# Patient Record
Sex: Female | Born: 1940 | Race: Black or African American | Hispanic: No | Marital: Single | Smoking: Never smoker
Health system: Southern US, Community
[De-identification: ages and names within clinical notes are randomized; demographics above are authoritative.]

## PROBLEM LIST (undated history)

## (undated) DIAGNOSIS — K5900 Constipation, unspecified: Secondary | ICD-10-CM

## (undated) DIAGNOSIS — E119 Type 2 diabetes mellitus without complications: Secondary | ICD-10-CM

## (undated) HISTORY — DX: Type 2 diabetes mellitus without complications: E11.9

## (undated) HISTORY — DX: Constipation, unspecified: K59.00

---

## 1977-08-29 HISTORY — PX: KNEE SURGERY: SHX244

## 2007-08-30 HISTORY — PX: SALIVARY GLAND SURGERY: SHX768

## 2007-08-30 HISTORY — PX: COLON SURGERY: SHX602

## 2009-01-19 ENCOUNTER — Emergency Department (HOSPITAL_BASED_OUTPATIENT_CLINIC_OR_DEPARTMENT_OTHER): Admission: EM | Admit: 2009-01-19 | Discharge: 2009-01-19 | Payer: Self-pay | Admitting: Emergency Medicine

## 2009-04-20 ENCOUNTER — Encounter (HOSPITAL_COMMUNITY): Admission: RE | Admit: 2009-04-20 | Discharge: 2009-04-20 | Payer: Self-pay | Admitting: Cardiology

## 2010-12-07 LAB — URINALYSIS, ROUTINE W REFLEX MICROSCOPIC
Leukocytes, UA: NEGATIVE
Nitrite: NEGATIVE
Protein, ur: NEGATIVE mg/dL
Specific Gravity, Urine: 1.033 — ABNORMAL HIGH (ref 1.005–1.030)
Urobilinogen, UA: 0.2 mg/dL (ref 0.0–1.0)

## 2010-12-07 LAB — BASIC METABOLIC PANEL
BUN: 14 mg/dL (ref 6–23)
CO2: 29 mEq/L (ref 19–32)
Calcium: 9.6 mg/dL (ref 8.4–10.5)
Chloride: 95 mEq/L — ABNORMAL LOW (ref 96–112)
Creatinine, Ser: 0.6 mg/dL (ref 0.4–1.2)
GFR calc Af Amer: >60 mL/min (ref >60)

## 2010-12-07 LAB — DIFFERENTIAL
Basophils Absolute: 0.1 10*3/uL (ref 0.0–0.1)
Basophils Relative: 1 % (ref 0–1)
Eosinophils Absolute: 0.1 10*3/uL (ref 0.0–0.7)
Neutro Abs: 2.3 10*3/uL (ref 1.7–7.7)
Neutrophils Relative %: 51 % (ref 43–77)

## 2010-12-07 LAB — GLUCOSE, CAPILLARY
Glucose-Capillary: 293 mg/dL — ABNORMAL HIGH (ref 70–99)
Glucose-Capillary: 427 mg/dL — ABNORMAL HIGH (ref 70–99)

## 2010-12-07 LAB — CBC
MCHC: 33.8 g/dL (ref 30.0–36.0)
MCV: 87.2 fL (ref 78.0–100.0)
Platelets: 289 10*3/uL (ref 150–400)
RBC: 4.82 MIL/uL (ref 3.87–5.11)

## 2010-12-07 LAB — KETONES, QUALITATIVE: Acetone, Bld: NEGATIVE

## 2010-12-07 LAB — URINE MICROSCOPIC-ADD ON

## 2011-08-16 DIAGNOSIS — K5900 Constipation, unspecified: Secondary | ICD-10-CM

## 2011-08-16 DIAGNOSIS — E119 Type 2 diabetes mellitus without complications: Secondary | ICD-10-CM

## 2011-08-16 HISTORY — DX: Constipation, unspecified: K59.00

## 2011-08-16 HISTORY — DX: Type 2 diabetes mellitus without complications: E11.9

## 2012-02-14 ENCOUNTER — Other Ambulatory Visit: Payer: Self-pay | Admitting: Cardiology

## 2012-08-20 ENCOUNTER — Other Ambulatory Visit: Payer: Self-pay | Admitting: Internal Medicine

## 2012-08-20 DIAGNOSIS — Z1231 Encounter for screening mammogram for malignant neoplasm of breast: Secondary | ICD-10-CM

## 2012-09-05 ENCOUNTER — Ambulatory Visit (HOSPITAL_COMMUNITY): Payer: Self-pay

## 2012-09-14 ENCOUNTER — Ambulatory Visit (HOSPITAL_COMMUNITY): Payer: Self-pay | Attending: Internal Medicine

## 2013-02-12 ENCOUNTER — Encounter: Payer: Self-pay | Admitting: *Deleted

## 2013-02-13 ENCOUNTER — Encounter: Payer: Self-pay | Admitting: Internal Medicine

## 2013-02-22 ENCOUNTER — Encounter: Payer: Self-pay | Admitting: *Deleted

## 2013-02-25 ENCOUNTER — Encounter: Payer: Self-pay | Admitting: *Deleted

## 2013-02-27 ENCOUNTER — Ambulatory Visit (INDEPENDENT_AMBULATORY_CARE_PROVIDER_SITE_OTHER): Payer: Medicare HMO | Admitting: Internal Medicine

## 2013-02-27 ENCOUNTER — Encounter: Payer: Self-pay | Admitting: Internal Medicine

## 2013-02-27 VITALS — BP 142/80 | HR 71 | Temp 98.2°F | Resp 18 | Ht 64.57 in | Wt 167.6 lb

## 2013-02-27 DIAGNOSIS — E118 Type 2 diabetes mellitus with unspecified complications: Secondary | ICD-10-CM | POA: Insufficient documentation

## 2013-02-27 DIAGNOSIS — R5383 Other fatigue: Secondary | ICD-10-CM

## 2013-02-27 DIAGNOSIS — M949 Disorder of cartilage, unspecified: Secondary | ICD-10-CM

## 2013-02-27 DIAGNOSIS — R5381 Other malaise: Secondary | ICD-10-CM

## 2013-02-27 DIAGNOSIS — E119 Type 2 diabetes mellitus without complications: Secondary | ICD-10-CM

## 2013-02-27 DIAGNOSIS — K5900 Constipation, unspecified: Secondary | ICD-10-CM

## 2013-02-27 DIAGNOSIS — M79674 Pain in right toe(s): Secondary | ICD-10-CM

## 2013-02-27 DIAGNOSIS — M79609 Pain in unspecified limb: Secondary | ICD-10-CM

## 2013-02-27 NOTE — Progress Notes (Signed)
Patient ID: Brooke Giles, female   DOB: 09/27/1939, 72 y.o.   MRN: [id number]   Code Status: Living will  No Known Allergies  Chief Complaint: to establish care  HPI:  72 y/o female patient is here to establish care. She was seeing Dr Sharyn Lull prior to this and last seen by him 4 months back. No known cardiac problems. Will need records from Dr Annitta Jersey office for review Colonoscopy in 2011 was normal as per patient and is due for another one in 3 year which would be now. She will call and schedule follow up appointment She has DM type 2 since 1997 and is taking glipizide and metformin. cbg yesterday was 110 and she checks it every other day. cbg ranging between 96-150. Has not seen her eye doctor in more than 1 year. No sores/ wounds in feet No diagnosed cholesterol problem or HTN bp slightly elevated this am She does treadmill and walks for exercise She tries to be careful with her meals and eats fruits and vegetables She occassionally has pain in her right big toe which sometimes is severe. Denies any redness or swelling noted.  Review of Systems  Constitutional: Positive for malaise/fatigue. Negative for fever, chills, weight loss and diaphoresis.  HENT: Negative for hearing loss, congestion, sore throat and tinnitus.   Eyes: Negative for blurred vision.       Last seen her eye doctor 1 year back.  Respiratory: Negative for cough and shortness of breath.   Cardiovascular: Negative for chest pain and palpitations.  Gastrointestinal: Positive for constipation. Negative for heartburn, nausea, vomiting, abdominal pain, diarrhea and blood in stool.  Genitourinary: Negative for dysuria, urgency, frequency and hematuria.  Musculoskeletal: Positive for back pain. Negative for myalgias and falls.  Skin: Negative for itching and rash.  Neurological: Negative for dizziness, tingling, tremors, seizures, weakness and headaches.  Psychiatric/Behavioral: Negative for depression, suicidal ideas and  memory loss. The patient is not nervous/anxious and does not have insomnia.      Past Medical History  Diagnosis Date  . Type II or unspecified type diabetes mellitus without mention of complication, not stated as uncontrolled 08/16/2011  . Unspecified constipation 08/16/2011   Past Surgical History  Procedure Laterality Date  . Knee surgery  1979  . Salivary gland surgery  2009  . Colon surgery  2009    benign mass   Social History:   reports that she has never smoked. She does not have any smokeless tobacco history on file. She reports that she does not drink alcohol or use illicit drugs.  Family History  Problem Relation Age of Onset  . Anuerysm Mother   . Heart disease Father   . Heart disease Son     Medications: Patient's Medications  New Prescriptions   No medications on file  Previous Medications   ASPIRIN 81 MG TABLET    Take 81 mg by mouth daily. Take 1 tablet daily to prevent heart attack, stroke.   GLIPIZIDE (GLUCOTROL) 10 MG TABLET    Take one tablet by mouth twice daily to control blood sugar   METFORMIN (GLUMETZA) 500 MG (MOD) 24 HR TABLET    Take 500 mg by mouth 2 (two) times daily with a meal. Taking 1 tablet twice daily with meals for diabetes.  Modified Medications   No medications on file  Discontinued Medications   No medications on file    Filed Vitals:   02/27/13 0819  BP: 142/80  Pulse: 71  Temp: 98.2 F (36.8  C)  TempSrc: Oral  Resp: 18  Height: 5' 4.57" (1.64 m)  Weight: 167 lb 9.6 oz (76.023 kg)  SpO2: 94%   Physical Exam  Constitutional: She is oriented to person, place, and time. She appears well-developed and well-nourished. No distress.  HENT:  Head: Normocephalic and atraumatic.  Mouth/Throat: Oropharynx is clear and moist. No oropharyngeal exudate.  Eyes: Conjunctivae are normal. Pupils are equal, round, and reactive to light.  Neck: Normal range of motion. Neck supple. No JVD present. No tracheal deviation present. No  thyromegaly present.  Cardiovascular: Normal rate, regular rhythm, normal heart sounds and intact distal pulses.   No murmur heard. Pulmonary/Chest: Effort normal and breath sounds normal. No respiratory distress. She has no wheezes. She has no rales. She exhibits no tenderness.  Abdominal: Soft. Bowel sounds are normal. She exhibits no distension and no mass.  Musculoskeletal: Normal range of motion. She exhibits no edema and no tenderness.  Lymphadenopathy:    She has no cervical adenopathy.  Neurological: She is alert and oriented to person, place, and time. No cranial nerve deficit.  Skin: Skin is warm and dry. She is not diaphoretic.  Psychiatric: She has a normal mood and affect. Her behavior is normal. Judgment and thought content normal.   No labs available for review  Assessment/Plan  DM type 2- continue glipizide and metformin at home dose for now. Need prior records for review. To sign medical release form. Will check a1c, cmp today and urine microalbumin. Continue asa. To get her eye appointment. bp under control. Check flp  Fatigue- rule out anemia- check cbc. Also rule out hypothyroidism in settng of her chronic fatigue and constipation  Constipation- continue prn stool softeners. Encouraged fiber intake and exercise  Toe pain- right toe pain intermittently at bedtime. Will rule out gout bu checking serum uric acid level  Will see her back in a month for complete physical with review of labs

## 2013-02-27 NOTE — Patient Instructions (Signed)
Please sign medical release form to get records from Dr Annitta Jersey office  Please schedule your follow up visit for colonoscopy and eye appointment

## 2013-03-04 LAB — COMPREHENSIVE METABOLIC PANEL
AST: 17 IU/L (ref 0–40)
Albumin/Globulin Ratio: 2.1 (ref 1.1–2.5)
BUN/Creatinine Ratio: 28 — ABNORMAL HIGH (ref 11–26)
Calcium: 10.1 mg/dL (ref 8.6–10.2)
Creatinine, Ser: 0.71 mg/dL (ref 0.57–1.00)
GFR calc Af Amer: 96 mL/min/{1.73_m2} (ref >59)
GFR calc non Af Amer: 84 mL/min/{1.73_m2} (ref >59)
Globulin, Total: 2.1 g/dL (ref 1.5–4.5)
Potassium: 4.4 mmol/L (ref 3.5–5.2)
Sodium: 145 mmol/L — ABNORMAL HIGH (ref 134–144)
Total Bilirubin: 0.5 mg/dL (ref 0.0–1.2)

## 2013-03-04 LAB — LIPID PANEL
Chol/HDL Ratio: 2.8 ratio units (ref 0.0–4.4)
HDL: 66 mg/dL (ref >39)
VLDL Cholesterol Cal: 22 mg/dL (ref 5–40)

## 2013-03-04 LAB — CBC WITH DIFFERENTIAL/PLATELET
Basos: 1 % (ref 0–3)
Eos: 4 % (ref 0–5)
HCT: 38 % (ref 34.0–46.6)
Immature Grans (Abs): 0 10*3/uL (ref 0.0–0.1)
Immature Granulocytes: 0 % (ref 0–2)
Monocytes: 6 % (ref 4–12)
Neutrophils Relative %: 46 % (ref 40–74)
RBC: 4.34 x10E6/uL (ref 3.77–5.28)
RDW: 14.8 % (ref 12.3–15.4)
WBC: 4.9 10*3/uL (ref 3.4–10.8)

## 2013-03-04 LAB — HEMOGLOBIN A1C: Est. average glucose Bld gHb Est-mCnc: 197 mg/dL

## 2013-03-04 LAB — TSH: TSH: 1.01 u[IU]/mL (ref 0.450–4.500)

## 2013-03-21 ENCOUNTER — Encounter: Payer: Self-pay | Admitting: *Deleted

## 2013-04-17 ENCOUNTER — Ambulatory Visit: Payer: Medicare HMO | Admitting: Internal Medicine

## 2013-06-12 ENCOUNTER — Ambulatory Visit: Payer: Medicare HMO | Admitting: Internal Medicine

## 2014-08-05 ENCOUNTER — Other Ambulatory Visit: Payer: Self-pay

## 2014-08-05 DIAGNOSIS — Z1231 Encounter for screening mammogram for malignant neoplasm of breast: Secondary | ICD-10-CM

## 2014-08-27 ENCOUNTER — Ambulatory Visit: Payer: Self-pay

## 2015-03-03 ENCOUNTER — Emergency Department (HOSPITAL_COMMUNITY)
Admission: EM | Admit: 2015-03-03 | Discharge: 2015-03-03 | Disposition: A | Discharge status: Home / Self Care | Payer: Medicare Other | Attending: Emergency Medicine | Admitting: Emergency Medicine

## 2015-03-03 ENCOUNTER — Encounter (HOSPITAL_COMMUNITY): Payer: Self-pay

## 2015-03-03 DIAGNOSIS — M79651 Pain in right thigh: Secondary | ICD-10-CM | POA: Diagnosis present

## 2015-03-03 DIAGNOSIS — M5431 Sciatica, right side: Secondary | ICD-10-CM

## 2015-03-03 DIAGNOSIS — Z79899 Other long term (current) drug therapy: Secondary | ICD-10-CM | POA: Insufficient documentation

## 2015-03-03 DIAGNOSIS — E119 Type 2 diabetes mellitus without complications: Secondary | ICD-10-CM | POA: Insufficient documentation

## 2015-03-03 DIAGNOSIS — Z7982 Long term (current) use of aspirin: Secondary | ICD-10-CM | POA: Insufficient documentation

## 2015-03-03 DIAGNOSIS — Z8719 Personal history of other diseases of the digestive system: Secondary | ICD-10-CM | POA: Insufficient documentation

## 2015-03-03 LAB — I-STAT CHEM 8, ED
BUN: 18 mg/dL (ref 6–20)
CALCIUM ION: 1.25 mmol/L (ref 1.13–1.30)
CHLORIDE: 104 mmol/L (ref 101–111)
Creatinine, Ser: 0.7 mg/dL (ref 0.44–1.00)
GLUCOSE: 118 mg/dL — AB (ref 65–99)
HCT: 38 % (ref 36.0–46.0)
Hemoglobin: 12.9 g/dL (ref 12.0–15.0)
Potassium: 5.1 mmol/L (ref 3.5–5.1)
Sodium: 140 mmol/L (ref 135–145)
TCO2: 27 mmol/L (ref 0–100)

## 2015-03-03 MED ORDER — IBUPROFEN 600 MG PO TABS: 600.0000 mg | ORAL_TABLET | Freq: Four times a day (QID) | ORAL | Status: DC | PRN

## 2015-03-03 NOTE — Discharge Instructions (Signed)
Sciatica °Sciatica is pain, weakness, numbness, or tingling along your sciatic nerve. The nerve starts in the lower back and runs down the back of each leg. Nerve damage or certain conditions pinch or put pressure on the sciatic nerve. This causes the pain, weakness, and other discomforts of sciatica. °HOME CARE  °· Only take medicine as told by your doctor. °· Apply ice to the affected area for 20 minutes. Do this 3-4 times a day for the first 48-72 hours. Then try heat in the same way. °· Exercise, stretch, or do your usual activities if these do not make your pain worse. °· Go to physical therapy as told by your doctor. °· Keep all doctor visits as told. °· Do not wear high heels or shoes that are not supportive. °· Get a firm mattress if your mattress is too soft to lessen pain and discomfort. °GET HELP RIGHT AWAY IF:  °· You cannot control when you poop (bowel movement) or pee (urinate). °· You have more weakness in your lower back, lower belly (pelvis), butt (buttocks), or legs. °· You have redness or puffiness (swelling) of your back. °· You have a burning feeling when you pee. °· You have pain that gets worse when you lie down. °· You have pain that wakes you from your sleep. °· Your pain is worse than past pain. °· Your pain lasts longer than 4 weeks. °· You are suddenly losing weight without reason. °MAKE SURE YOU:  °· Understand these instructions. °· Will watch this condition. °· Will get help right away if you are not doing well or get worse. °Document Released: 05/24/2008 Document Revised: 02/14/2012 Document Reviewed: 12/25/2011 °[employer]® Patient Information ©2015 [employer]. This information is not intended to replace advice given to you by your health care provider. Make sure you discuss any questions you have with your health care provider. ° °

## 2015-03-03 NOTE — ED Notes (Addendum)
Pt c/o R upper leg pain radiating into R low back and R lower leg x 4 days.  Pain score 8/10.  Denies injury.  Pt reports "it felt a little better after a hot bath."  Pt has not taken anything for the pain.  Hx of R knee surgery x 20 years ago.  Pt ambulated to room w/o difficulty.

## 2015-03-03 NOTE — ED Provider Notes (Signed)
CSN: [id number]     Arrival date & time 03/03/15  0722 History   First MD Initiated Contact with Patient 03/03/15 [phone]     Chief Complaint  Patient presents with  . Leg Pain     HPI Pt c/o R upper leg pain radiating into R low back and R lower leg x 4 days. Pain score 8/10. Denies injury. Pt reports "it felt a little better after a hot bath." Pt has not taken anything for the pain. Hx of R knee surgery x 20 years ago. Pt ambulated to room w/o difficulty.  Past Medical History  Diagnosis Date  . Type II or unspecified type diabetes mellitus without mention of complication, not stated as uncontrolled 08/16/2011  . Unspecified constipation 08/16/2011   Past Surgical History  Procedure Laterality Date  . Knee surgery  1979  . Salivary gland surgery  2009  . Colon surgery  2009    benign mass   Family History  Problem Relation Age of Onset  . Anuerysm Mother   . Heart disease Father   . Heart disease Son    History  Substance Use Topics  . Smoking status: Never Smoker   . Smokeless tobacco: Not on file  . Alcohol Use: No   OB History    No data available     Review of Systems  All other systems reviewed and are negative.     Allergies  Review of patient's allergies indicates no known allergies.  Home Medications   Prior to Admission medications   Medication Sig Start Date End Date Taking? Authorizing Provider  aspirin 81 MG tablet Take 81 mg by mouth daily. Take 1 tablet daily to prevent heart attack, stroke.    Historical Provider, MD  glipiZIDE (GLUCOTROL) 10 MG tablet Take one tablet by mouth twice daily to control blood sugar    Historical Provider, MD  ibuprofen (ADVIL,MOTRIN) 600 MG tablet Take 1 tablet (600 mg total) by mouth every 6 (six) hours as needed. 03/03/15   Nelva Nayobert Tajon Moring, MD  metFORMIN (GLUMETZA) 500 MG (MOD) 24 hr tablet Take 500 mg by mouth 2 (two) times daily with a meal. Taking 1 tablet twice daily with meals for diabetes.    Historical  Provider, MD   BP 138/84 mmHg  Pulse 71  Temp(Src) 98 F (36.7 C) (Oral)  Resp 16  SpO2 100% Physical Exam  Constitutional: She is oriented to person, place, and time. She appears well-developed and well-nourished. No distress.  HENT:  Head: Normocephalic and atraumatic.  Eyes: Pupils are equal, round, and reactive to light.  Neck: Normal range of motion.  Cardiovascular: Normal rate and intact distal pulses.   Pulmonary/Chest: No respiratory distress.  Abdominal: Normal appearance. She exhibits no distension.  Musculoskeletal:       Back:  Neurological: She is alert and oriented to person, place, and time. No cranial nerve deficit.  Skin: Skin is warm and dry. No rash noted.  Psychiatric: She has a normal mood and affect. Her behavior is normal.  Nursing note and vitals reviewed.   ED Course  Procedures (including critical care time) Labs Review Labs Reviewed  I-STAT CHEM 8, ED - Abnormal; Notable for the following:    Glucose, Bld 118 (*)    All other components within normal limits         MDM   Final diagnoses:  Sciatica, right        Nelva Nayobert Emersen Mascari, MD 03/15/15 [phone]

## 2015-03-20 ENCOUNTER — Encounter (HOSPITAL_COMMUNITY): Payer: Self-pay

## 2015-03-20 ENCOUNTER — Emergency Department (HOSPITAL_COMMUNITY)
Admission: EM | Admit: 2015-03-20 | Discharge: 2015-03-20 | Disposition: A | Discharge status: Home / Self Care | Payer: Medicare Other | Attending: Emergency Medicine | Admitting: Emergency Medicine

## 2015-03-20 DIAGNOSIS — Z7982 Long term (current) use of aspirin: Secondary | ICD-10-CM | POA: Diagnosis not present

## 2015-03-20 DIAGNOSIS — Z8719 Personal history of other diseases of the digestive system: Secondary | ICD-10-CM | POA: Insufficient documentation

## 2015-03-20 DIAGNOSIS — E119 Type 2 diabetes mellitus without complications: Secondary | ICD-10-CM | POA: Diagnosis not present

## 2015-03-20 DIAGNOSIS — Z79899 Other long term (current) drug therapy: Secondary | ICD-10-CM | POA: Insufficient documentation

## 2015-03-20 DIAGNOSIS — M5441 Lumbago with sciatica, right side: Secondary | ICD-10-CM | POA: Diagnosis not present

## 2015-03-20 DIAGNOSIS — Z791 Long term (current) use of non-steroidal anti-inflammatories (NSAID): Secondary | ICD-10-CM | POA: Diagnosis not present

## 2015-03-20 DIAGNOSIS — M545 Low back pain: Secondary | ICD-10-CM | POA: Diagnosis present

## 2015-03-20 MED ORDER — PREDNISONE 10 MG (21) PO TBPK: 10.0000 mg | ORAL_TABLET | Freq: Every day | ORAL | Status: DC

## 2015-03-20 NOTE — ED Notes (Signed)
Patient c/o right lower back pain that radiates into the right buttock and right leg. Patient states she was seen by her PCP and was told she had sciatica. Patient states she was given an Rx. For pain medication, but is not working today.

## 2015-03-20 NOTE — ED Provider Notes (Signed)
CSN: [id number]     Arrival date & time 03/20/15  1252 History   First MD Initiated Contact with Patient 03/20/15 1318     Chief Complaint  Patient presents with  . Back Pain     (Consider location/radiation/quality/duration/timing/severity/associated sxs/prior Treatment) Patient is a 74 y.o. female presenting with back pain. The history is provided by the patient. No language interpreter was used.  Back Pain Associated symptoms: no fever   Ms. Barretta is a 74 y.o female with a history of diabetes who presents for back pain since July 4. She was seen in the ED on July 5 by Dr. Radford Pax and given ibuprofen as well as PCP follow-up. She states she followed up with her primary care physician who gave her meloxicam but that her pain is still there. Pain is better when laying in supine position and radiates down her right leg. Pain is 8/10 now. 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 She denies any bowel or bladder incontinence or retention, IV drug use, recent steroid use. She denies any fever, chills, abdominal pain, nausea, vomiting , leg swelling or weakness. Past Medical History  Diagnosis Date  . Type II or unspecified type diabetes mellitus without mention of complication, not stated as uncontrolled 08/16/2011  . Unspecified constipation 08/16/2011   Past Surgical History  Procedure Laterality Date  . Knee surgery  1979  . Salivary gland surgery  2009  . Colon surgery  2009    benign mass   Family History  Problem Relation Age of Onset  . Anuerysm Mother   . Heart disease Father   . Heart disease Son    History  Substance Use Topics  . Smoking status: Never Smoker   . Smokeless tobacco: Never Used  . Alcohol Use: No   OB History    No data available     Review of Systems  Constitutional: Negative for fever and chills.  Musculoskeletal: Positive for back pain. Negative for myalgias, joint swelling, arthralgias and gait problem.  All other systems  reviewed and are negative.     Allergies  Review of patient's allergies indicates no known allergies.  Home Medications   Prior to Admission medications   Medication Sig Start Date End Date Taking? Authorizing Provider  aspirin 81 MG tablet Take 81 mg by mouth daily. Take 1 tablet daily to prevent heart attack, stroke.   Yes Historical Provider, MD  glipiZIDE (GLUCOTROL) 10 MG tablet Take one tablet by mouth twice daily to control blood sugar   Yes Historical Provider, MD  meloxicam (MOBIC) 15 MG tablet Take 15 mg by mouth daily.   Yes Historical Provider, MD  metFORMIN (GLUCOPHAGE) 1000 MG tablet Take 1,000 mg by mouth 2 (two) times daily with a meal.   Yes Historical Provider, MD  omeprazole (PRILOSEC) 20 MG capsule Take 20 mg by mouth daily.   Yes Historical Provider, MD  ibuprofen (ADVIL,MOTRIN) 600 MG tablet Take 1 tablet (600 mg total) by mouth every 6 (six) hours as needed. Patient not taking: Reported on 03/20/2015 03/03/15   Nelva Nay, MD  predniSONE (STERAPRED UNI-PAK 21 TAB) 10 MG (21) TBPK tablet Take 1 tablet (10 mg total) by mouth daily. Take 6 tabs by mouth daily  for 2 days, then 5 tabs for 2 days, then 4 tabs for 2 days, then 3 tabs for 2 days, 2 tabs for 2 days, then 1 tab by mouth daily for 2 days 03/20/15  Aniyla Harling Patel-Mills, PA-C   BP 162/82 mmHg  Pulse 70  Temp(Src) 97.8 F (36.6 C) (Oral)  Resp 18  Ht  (1.626 m)  Wt 156 lb (70.761 kg)  BMI 26.76 kg/m2  SpO2 100% Physical Exam  Constitutional: She is oriented to person, place, and time. She appears well-developed and well-nourished.  HENT:  Head: Normocephalic and atraumatic.  Eyes: Conjunctivae are normal.  Neck: Normal range of motion. Neck supple.  Cardiovascular: Normal rate, regular rhythm and normal heart sounds.   Pulmonary/Chest: Effort normal and breath sounds normal.  Abdominal: Soft. There is no tenderness.  Musculoskeletal: Normal range of motion. She exhibits no edema or tenderness.  No  reproducible pain. She is ambulatory with steady gait. Negative straight leg raise bilaterally. NVI. No saddle anesthesia. Pelvis is stable. 5/5 strength in bilateral lower extremities.  Neurological: She is alert and oriented to person, place, and time.  Skin: Skin is warm and dry.  Psychiatric: She has a normal mood and affect. Her behavior is normal.  Nursing note and vitals reviewed.   ED Course  Procedures (including critical care time) Labs Review Labs Reviewed - No data to display  Imaging Review No results found.   EKG Interpretation None      MDM   Final diagnoses:  Right-sided low back pain with right-sided sciatica   Patient presents for back pain that radiates from right buttox down the right leg.  Her pain is consistent with right sciatic pain. She has no abdominal pain or tenderness to palpation that would suggest a ruptured AAA. Her vitals are stable and she is in no acute distress.  She is ambulatory and there are no concerning signs of cauda equina syndrome. The patient is currently taking meloxicam for pain and states it help when she does take it but the pain returns when the medication wears off. I have given her prednisone. I discussed that this pain does not go away for days and possibly weeks for some individuals. She can follow up with her pcp and agrees with the plan.      Nichelle Renwick, PA-C 03/20/15 1743  Bethann Berkshire, MD 03/21/15 [phone]

## 2015-03-20 NOTE — Discharge Instructions (Signed)

## 2016-11-24 ENCOUNTER — Encounter (HOSPITAL_COMMUNITY): Payer: Self-pay | Admitting: Emergency Medicine

## 2016-11-24 ENCOUNTER — Emergency Department (HOSPITAL_COMMUNITY)
Admission: EM | Admit: 2016-11-24 | Discharge: 2016-11-25 | Disposition: A | Discharge status: Home / Self Care | Payer: Medicare Other | Attending: Emergency Medicine | Admitting: Emergency Medicine

## 2016-11-24 ENCOUNTER — Emergency Department (HOSPITAL_COMMUNITY): Payer: Medicare Other

## 2016-11-24 DIAGNOSIS — Z7984 Long term (current) use of oral hypoglycemic drugs: Secondary | ICD-10-CM | POA: Insufficient documentation

## 2016-11-24 DIAGNOSIS — K5901 Slow transit constipation: Secondary | ICD-10-CM | POA: Insufficient documentation

## 2016-11-24 DIAGNOSIS — K5900 Constipation, unspecified: Secondary | ICD-10-CM | POA: Diagnosis present

## 2016-11-24 DIAGNOSIS — E119 Type 2 diabetes mellitus without complications: Secondary | ICD-10-CM | POA: Insufficient documentation

## 2016-11-24 DIAGNOSIS — Z7982 Long term (current) use of aspirin: Secondary | ICD-10-CM | POA: Diagnosis not present

## 2016-11-24 LAB — CBC
HCT: 36.7 % (ref 36.0–46.0)
HEMOGLOBIN: 11.9 g/dL — AB (ref 12.0–15.0)
MCH: 27.5 pg (ref 26.0–34.0)
MCHC: 32.4 g/dL (ref 30.0–36.0)
MCV: 84.8 fL (ref 78.0–100.0)
PLATELETS: 310 10*3/uL (ref 150–400)
RBC: 4.33 MIL/uL (ref 3.87–5.11)
RDW: 14.2 % (ref 11.5–15.5)
WBC: 6.2 10*3/uL (ref 4.0–10.5)

## 2016-11-24 LAB — URINALYSIS, ROUTINE W REFLEX MICROSCOPIC
Bacteria, UA: NONE SEEN
Bilirubin Urine: NEGATIVE
Glucose, UA: 50 mg/dL — AB
Ketones, ur: NEGATIVE mg/dL
NITRITE: NEGATIVE
Protein, ur: NEGATIVE mg/dL
SPECIFIC GRAVITY, URINE: 1.028 (ref 1.005–1.030)
pH: 5 (ref 5.0–8.0)

## 2016-11-24 LAB — COMPREHENSIVE METABOLIC PANEL
ALBUMIN: 4 g/dL (ref 3.5–5.0)
ALK PHOS: 68 U/L (ref 38–126)
ALT: 18 U/L (ref 14–54)
AST: 24 U/L (ref 15–41)
Anion gap: 8 (ref 5–15)
BUN: 24 mg/dL — ABNORMAL HIGH (ref 6–20)
CHLORIDE: 107 mmol/L (ref 101–111)
CO2: 26 mmol/L (ref 22–32)
CREATININE: 0.7 mg/dL (ref 0.44–1.00)
Calcium: 9.6 mg/dL (ref 8.9–10.3)
GFR calc non Af Amer: >60 mL/min (ref >60)
GLUCOSE: 201 mg/dL — AB (ref 65–99)
Potassium: 3.6 mmol/L (ref 3.5–5.1)
SODIUM: 141 mmol/L (ref 135–145)
Total Bilirubin: 0.7 mg/dL (ref 0.3–1.2)
Total Protein: 7.4 g/dL (ref 6.5–8.1)

## 2016-11-24 LAB — LIPASE, BLOOD: LIPASE: 20 U/L (ref 11–51)

## 2016-11-24 MED ORDER — MAGNESIUM CITRATE PO SOLN: 1.0000 | Freq: Once | ORAL | 0 refills | Status: DC | PRN

## 2016-11-24 MED ORDER — IOPAMIDOL (ISOVUE-300) INJECTION 61%
INTRAVENOUS | Status: AC
  Filled 2016-11-24: qty 100

## 2016-11-24 MED ORDER — IOPAMIDOL (ISOVUE-300) INJECTION 61%
100.0000 mL | Freq: Once | INTRAVENOUS | Status: AC | PRN
  Administered 2016-11-24: 100 mL via INTRAVENOUS

## 2016-11-24 MED ORDER — POLYETHYLENE GLYCOL 3350 17 GM/SCOOP PO POWD: 17.0000 g | Freq: Every day | ORAL | 0 refills | Status: DC

## 2016-11-24 NOTE — ED Provider Notes (Addendum)
[facility]-EMERGENCY DEPT Provider Note   CSN: [id number] Arrival date & time: 11/24/16  1811     History   Chief Complaint Chief Complaint  Patient presents with  . Constipation    HPI Brooke Giles is a 76 y.o. female.  HPI 76 year female who presents with constipation. Last bowel movement 8 days ago. Started having low abdominal pain one day ago. No fever or chills, nausea or vomiting, rectal bleeding, dysuria, urinary frequency. History of colon surgery for benign mass. c/o abdominal distension as well. Passing gas without difficulty. Feels rectal pressure. Has not taken medications for her symptoms. No aggravating or alleviating factors. Currently mild low abdominal pain.   Past Medical History:  Diagnosis Date  . Type II or unspecified type diabetes mellitus without mention of complication, not stated as uncontrolled 08/16/2011  . Unspecified constipation 08/16/2011    Patient Active Problem List   Diagnosis Date Noted  . DM type 2 (diabetes mellitus, type 2) (HCC) 02/27/2013  . Other malaise and fatigue 02/27/2013  . Unspecified constipation 02/27/2013    Past Surgical History:  Procedure Laterality Date  . COLON SURGERY  2009   benign mass  . KNEE SURGERY  1979  . SALIVARY GLAND SURGERY  2009    OB History    No data available       Home Medications    Prior to Admission medications   Medication Sig Start Date End Date Taking? Authorizing Provider  aspirin 81 MG tablet Take 81 mg by mouth daily. Take 1 tablet daily to prevent heart attack, stroke.   Yes Historical Provider, MD  glipiZIDE (GLUCOTROL) 10 MG tablet Take one tablet by mouth twice daily to control blood sugar   Yes Historical Provider, MD  ibuprofen (ADVIL,MOTRIN) 200 MG tablet Take 800 mg by mouth every 6 (six) hours as needed for moderate pain.   Yes Historical Provider, MD  metFORMIN (GLUCOPHAGE) 1000 MG tablet Take 1,000 mg by mouth 2 (two) times daily with a meal.   Yes Historical Provider, MD    ibuprofen (ADVIL,MOTRIN) 600 MG tablet Take 1 tablet (600 mg total) by mouth every 6 (six) hours as needed. Patient not taking: Reported on 03/20/2015 03/03/15   Nelva Nay, MD  magnesium citrate SOLN Take 296 mLs (1 Bottle total) by mouth once as needed for severe constipation. 11/24/16   Lavera Guise, MD  polyethylene glycol powder (GLYCOLAX/MIRALAX) powder Take 17 g by mouth daily. Dissolve one capful of powder into any liquid and take once daily. If no bowel movement in 3 days, take twice daily. If still no bowel movement in 3 days, take three times daily. 11/24/16   Lavera Guise, MD  predniSONE (STERAPRED UNI-PAK 21 TAB) 10 MG (21) TBPK tablet Take 1 tablet (10 mg total) by mouth daily. Take 6 tabs by mouth daily  for 2 days, then 5 tabs for 2 days, then 4 tabs for 2 days, then 3 tabs for 2 days, 2 tabs for 2 days, then 1 tab by mouth daily for 2 days Patient not taking: Reported on 11/24/2016 03/20/15   Catha Gosselin, PA-C    Family History Family History  Problem Relation Age of Onset  . Anuerysm Mother   . Heart disease Father   . Heart disease Son     Social History Social History  Substance Use Topics  . Smoking status: Never Smoker  . Smokeless tobacco: Never Used  . Alcohol use No     Allergies  Patient has no known allergies.   Review of Systems Review of Systems 10/14 systems reviewed and are negative other than those stated in the HPI   Physical Exam Updated Vital Signs BP (!) 161/81   Pulse 75   Temp 98.5 F (36.9 C) (Oral)   Resp 15   Ht 5\' 4"  (1.626 m)   Wt 155 lb (70.3 kg)   SpO2 95%   BMI 26.61 kg/m   Physical Exam Physical Exam  Nursing note and vitals reviewed. Constitutional: Well developed, well nourished, non-toxic, and in no acute distress Head: Normocephalic and atraumatic.  Mouth/Throat: Oropharynx is clear and moist.  Neck: Normal range of motion. Neck supple.  Cardiovascular: Normal rate and regular rhythm.   Pulmonary/Chest:  Effort normal and breath sounds normal.  Abdominal: Soft. There is low abdominal tenderness. There is no rebound and no guarding. fecal impaction on rectal exam.  Musculoskeletal: Normal range of motion.  Neurological: Alert, no facial droop, fluent speech, moves all extremities symmetrically Skin: Skin is warm and dry.  Psychiatric: Cooperative   ED Treatments / Results  Labs (all labs ordered are listed, but only abnormal results are displayed) Labs Reviewed  COMPREHENSIVE METABOLIC PANEL - Abnormal; Notable for the following:       Result Value   Glucose, Bld 201 (*)    BUN 24 (*)    All other components within normal limits  CBC - Abnormal; Notable for the following:    Hemoglobin 11.9 (*)    All other components within normal limits  URINALYSIS, ROUTINE W REFLEX MICROSCOPIC - Abnormal; Notable for the following:    APPearance HAZY (*)    Glucose, UA 50 (*)    Hgb urine dipstick SMALL (*)    Leukocytes, UA TRACE (*)    Squamous Epithelial / LPF 0-5 (*)    All other components within normal limits  LIPASE, BLOOD    EKG  EKG Interpretation None       Radiology Ct Abdomen Pelvis W Contrast  Result Date: 11/24/2016 CLINICAL DATA:  76 y/o  F; a days of constipation. EXAM: CT ABDOMEN AND PELVIS WITH CONTRAST TECHNIQUE: Multidetector CT imaging of the abdomen and pelvis was performed using the standard protocol following bolus administration of intravenous contrast. CONTRAST:  1 ISOVUE-300 IOPAMIDOL (ISOVUE-300) INJECTION 61% COMPARISON:  None. FINDINGS: Lower chest: 3 mm left lower lobe pulmonary nodule (series 4, image 5). Hepatobiliary: Subcentimeter lucency at the periphery of right lobe of liver probably represents a cyst. Otherwise no focal liver abnormality is seen. No gallstones, gallbladder wall thickening, or biliary dilatation. Pancreas: Unremarkable. No pancreatic ductal dilatation or surrounding inflammatory changes. Spleen: Normal in size without focal abnormality.  Adrenals/Urinary Tract: Right kidney interpolar cysts measuring up to 15 mm. Kidneys are otherwise unremarkable. No hydronephrosis or urinary stone disease. Normal bladder. Normal adrenal glands. Stomach/Bowel: Stomach is within normal limits. Appendix appears normal. No evidence of bowel wall thickening, distention, or inflammatory changes. Scattered sigmoid diverticulosis. Moderate volume of stool in the colon. Vascular/Lymphatic: Aortic atherosclerosis. No enlarged abdominal or pelvic lymph nodes. 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 Reproductive: Uterus and bilateral adnexa are unremarkable. Other: No abdominal wall hernia or abnormality. No abdominopelvic ascites. Musculoskeletal: Mild thoracolumbar spondylosis with disc and facet degenerative changes. No high-grade bony canal stenosis. Mild bilateral hip osteoarthrosis. No acute osseous abnormality is evident. IMPRESSION: 1. No acute process identified as explanation for abdominal pain. Moderate volume of stool throughout the colon.  2. 3 mm left lower lobe pulmonary nodule. No follow-up needed if patient is low-risk. Non-contrast chest CT can be considered in 12 months if patient is high-risk. This recommendation follows the consensus statement: Guidelines for Management of Incidental Pulmonary Nodules Detected on CT Images: From the Fleischner Society 2017; Radiology 2017; 284:228-243. 3. Scattered sigmoid diverticulosis without evidence for diverticulitis. 4. 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 5. Mild bilateral hip and thoracolumbar spine degenerative changes. Electronically Signed   By: Mitzi HansenLance  Furusawa-Stratton M.D.   On: 11/24/2016 22:59    Procedures Fecal disimpaction Date/Time: 11/24/2016 11:34 PM Performed by: Crista CurbLIU, DANA DUO Authorized by: Crista CurbLIU, DANA DUO  Consent: Verbal consent obtained. Risks and benefits: risks, benefits  and alternatives were discussed Consent given by: patient Patient understanding: patient states understanding of the procedure being performed Patient consent: the patient's understanding of the procedure matches consent given Procedure consent: procedure consent matches procedure scheduled Patient identity confirmed: verbally with patient Time out: Immediately prior to procedure a "time out" was called to verify the correct patient, procedure, equipment, support staff and site/side marked as required. Preparation: Patient was prepped and draped in the usual sterile fashion. Local anesthesia used: no  Anesthesia: Local anesthesia used: no  Sedation: Patient sedated: no Patient tolerance: Patient tolerated the procedure well with no immediate complications    (including critical care time)  Medications Ordered in ED Medications  iopamidol (ISOVUE-300) 61 % injection 100 mL (100 mLs Intravenous Contrast Given 11/24/16 2233)     Initial Impression / Assessment and Plan / ED Course  I have reviewed the triage vital signs and the nursing notes.  Pertinent labs & imaging results that were available during my care of the patient were reviewed by me and considered in my medical decision making (see chart for details).     p/w constipation and low abdominal pain. Well appearing, non-toxic with non-surgical abdomen. Low abdominal tenderness on exam. Fecal impaction on rectal exam, and she is disimpacted. Feels improved. Given low abdominal pain and age, CT obtained to rule out obstruction versus appendicitis versus diverticulitis or other acute intraabdominal processes. CT visualized and does now show acute intraabdominal processes. Blood work reassuring. Will start on bowel regimen.   Strict return and follow-up instructions reviewed. She expressed understanding of all discharge instructions and felt comfortable with the plan of care.   Final Clinical Impressions(s) / ED Diagnoses    Final diagnoses:  Slow transit constipation    New Prescriptions New Prescriptions   MAGNESIUM CITRATE SOLN    Take 296 mLs (1 Bottle total) by mouth once as needed for severe constipation.   POLYETHYLENE GLYCOL POWDER (GLYCOLAX/MIRALAX) POWDER    Take 17 g by mouth daily. Dissolve one capful of powder into any liquid and take once daily. If no bowel movement in 3 days, take twice daily. If still no bowel movement in 3 days, take three times daily.     Lavera Guiseana Duo Liu, MD 11/24/16 [id number]    Lavera Guiseana Duo Liu, MD 11/25/16 [phone]

## 2016-11-24 NOTE — Discharge Instructions (Signed)
Please eat a high-fiber diet. We are started on constipation medications. Please take as prescribed.  If you are having persistent constipation despite miralax in 3-4 days, take a bottle of the magnesium citrate. Return without fail for worsening symptoms, including severe abdominal pain, intractable vomiting, fevers, or any other symptoms concerning to you.

## 2016-11-24 NOTE — ED Triage Notes (Signed)
Pt c/o constipation x 8 days, last BP 8 days ago, LLQ abdominal pain onset today. Drove 11 hours on Wednesday. Dizziness x several days, worse when changing position from sitting to standing.

## 2016-11-24 NOTE — ED Notes (Signed)
Patient transported to CT 

## 2016-11-24 NOTE — ED Notes (Signed)
ED Provider at bedside. 

## 2016-12-14 ENCOUNTER — Other Ambulatory Visit: Payer: Self-pay | Admitting: Cardiology

## 2016-12-14 DIAGNOSIS — R079 Chest pain, unspecified: Secondary | ICD-10-CM

## 2016-12-30 ENCOUNTER — Encounter (HOSPITAL_COMMUNITY): Payer: Self-pay

## 2016-12-30 ENCOUNTER — Encounter (HOSPITAL_COMMUNITY)
Admission: RE | Admit: 2016-12-30 | Discharge: 2016-12-30 | Disposition: A | Discharge status: Home / Self Care | Payer: Medicare Other | Source: Ambulatory Visit | Attending: Cardiology | Admitting: Cardiology

## 2016-12-30 DIAGNOSIS — R079 Chest pain, unspecified: Secondary | ICD-10-CM | POA: Insufficient documentation

## 2016-12-30 LAB — HEPATIC FUNCTION PANEL
ALBUMIN: 4 g/dL (ref 3.5–5.0)
ALT: 15 U/L (ref 14–54)
AST: 18 U/L (ref 15–41)
Alkaline Phosphatase: 62 U/L (ref 38–126)
BILIRUBIN TOTAL: 1 mg/dL (ref 0.3–1.2)
Bilirubin, Direct: 0.2 mg/dL (ref 0.1–0.5)
Indirect Bilirubin: 0.8 mg/dL (ref 0.3–0.9)
TOTAL PROTEIN: 7.4 g/dL (ref 6.5–8.1)

## 2016-12-30 LAB — BASIC METABOLIC PANEL
Anion gap: 9 (ref 5–15)
BUN: 12 mg/dL (ref 6–20)
CALCIUM: 9.5 mg/dL (ref 8.9–10.3)
CO2: 28 mmol/L (ref 22–32)
CREATININE: 0.77 mg/dL (ref 0.44–1.00)
Chloride: 104 mmol/L (ref 101–111)
GFR calc non Af Amer: >60 mL/min (ref >60)
Glucose, Bld: 135 mg/dL — ABNORMAL HIGH (ref 65–99)
Potassium: 4.3 mmol/L (ref 3.5–5.1)
Sodium: 141 mmol/L (ref 135–145)

## 2016-12-30 LAB — LIPID PANEL
CHOL/HDL RATIO: 2.9 ratio
CHOLESTEROL: 196 mg/dL (ref 0–200)
HDL: 67 mg/dL (ref >40)
LDL Cholesterol: 111 mg/dL — ABNORMAL HIGH (ref 0–99)
TRIGLYCERIDES: 91 mg/dL (ref <150)
VLDL: 18 mg/dL (ref 0–40)

## 2016-12-30 LAB — URIC ACID: Uric Acid, Serum: 6 mg/dL (ref 2.3–6.6)

## 2016-12-30 MED ORDER — TECHNETIUM TC 99M TETROFOSMIN IV KIT
30.0000 | PACK | Freq: Once | INTRAVENOUS | Status: AC | PRN
  Administered 2016-12-30: 30 via INTRAVENOUS

## 2016-12-30 MED ORDER — REGADENOSON 0.4 MG/5ML IV SOLN
0.4000 mg | Freq: Once | INTRAVENOUS | Status: AC
  Administered 2016-12-30: 0.4 mg via INTRAVENOUS

## 2016-12-30 MED ORDER — REGADENOSON 0.4 MG/5ML IV SOLN
INTRAVENOUS | Status: AC
  Administered 2016-12-30: 0.4 mg via INTRAVENOUS
  Filled 2016-12-30: qty 5

## 2016-12-30 MED ORDER — TECHNETIUM TC 99M TETROFOSMIN IV KIT
10.0000 | PACK | Freq: Once | INTRAVENOUS | Status: AC | PRN
  Administered 2016-12-30: 10 via INTRAVENOUS

## 2016-12-31 LAB — HEMOGLOBIN A1C
HEMOGLOBIN A1C: 8 % — AB (ref 4.8–5.6)
Mean Plasma Glucose: 183 mg/dL

## 2017-03-03 ENCOUNTER — Emergency Department (HOSPITAL_COMMUNITY): Payer: Medicare Other

## 2017-03-03 ENCOUNTER — Emergency Department (HOSPITAL_COMMUNITY)
Admission: EM | Admit: 2017-03-03 | Discharge: 2017-03-03 | Disposition: A | Discharge status: Home / Self Care | Payer: Medicare Other | Attending: Emergency Medicine | Admitting: Emergency Medicine

## 2017-03-03 ENCOUNTER — Encounter (HOSPITAL_COMMUNITY): Payer: Self-pay

## 2017-03-03 DIAGNOSIS — Z7984 Long term (current) use of oral hypoglycemic drugs: Secondary | ICD-10-CM | POA: Diagnosis not present

## 2017-03-03 DIAGNOSIS — M89311 Hypertrophy of bone, right shoulder: Secondary | ICD-10-CM | POA: Insufficient documentation

## 2017-03-03 DIAGNOSIS — M89319 Hypertrophy of bone, unspecified shoulder: Secondary | ICD-10-CM

## 2017-03-03 DIAGNOSIS — Z7982 Long term (current) use of aspirin: Secondary | ICD-10-CM | POA: Diagnosis not present

## 2017-03-03 DIAGNOSIS — R222 Localized swelling, mass and lump, trunk: Secondary | ICD-10-CM | POA: Diagnosis present

## 2017-03-03 DIAGNOSIS — E119 Type 2 diabetes mellitus without complications: Secondary | ICD-10-CM | POA: Diagnosis not present

## 2017-03-03 LAB — BASIC METABOLIC PANEL
Anion gap: 8 (ref 5–15)
BUN: 15 mg/dL (ref 6–20)
CO2: 28 mmol/L (ref 22–32)
CREATININE: 0.64 mg/dL (ref 0.44–1.00)
Calcium: 9.9 mg/dL (ref 8.9–10.3)
Chloride: 105 mmol/L (ref 101–111)
GFR calc Af Amer: >60 mL/min (ref >60)
Glucose, Bld: 267 mg/dL — ABNORMAL HIGH (ref 65–99)
POTASSIUM: 4.1 mmol/L (ref 3.5–5.1)
SODIUM: 141 mmol/L (ref 135–145)

## 2017-03-03 LAB — CBC WITH DIFFERENTIAL/PLATELET
Basophils Absolute: 0 K/uL (ref 0.0–0.1)
Basophils Relative: 1 %
Eosinophils Absolute: 0.1 K/uL (ref 0.0–0.7)
Eosinophils Relative: 2 %
HCT: 38.9 % (ref 36.0–46.0)
Hemoglobin: 13.3 g/dL (ref 12.0–15.0)
Lymphocytes Relative: 42 %
Lymphs Abs: 2.1 K/uL (ref 0.7–4.0)
MCH: 29 pg (ref 26.0–34.0)
MCHC: 34.2 g/dL (ref 30.0–36.0)
MCV: 84.7 fL (ref 78.0–100.0)
Monocytes Absolute: 0.5 K/uL (ref 0.1–1.0)
Monocytes Relative: 9 %
Neutro Abs: 2.3 K/uL (ref 1.7–7.7)
Neutrophils Relative %: 46 %
Platelets: 337 K/uL (ref 150–400)
RBC: 4.59 MIL/uL (ref 3.87–5.11)
RDW: 15.3 % (ref 11.5–15.5)
WBC: 5 K/uL (ref 4.0–10.5)

## 2017-03-03 MED ORDER — IOPAMIDOL (ISOVUE-300) INJECTION 61%
75.0000 mL | Freq: Once | INTRAVENOUS | Status: AC | PRN
  Administered 2017-03-03: 75 mL via INTRAVENOUS

## 2017-03-03 MED ORDER — IOPAMIDOL (ISOVUE-300) INJECTION 61%
INTRAVENOUS | Status: AC
  Filled 2017-03-03: qty 100

## 2017-03-03 NOTE — ED Provider Notes (Signed)
Patient signed out to me by Dr. Rennis ChrisJacobowitz and CT results reviewed with patient and without acute findings. Will be referred back to her primary care doctor and return precautions given   Lorre NickAllen, Zechariah Bissonnette, MD 03/03/17 [id number]

## 2017-03-03 NOTE — ED Triage Notes (Signed)
Patient states she noticed a raised area to the right clavicle 4 days ago. Patient denies any pain

## 2017-03-03 NOTE — ED Notes (Signed)
Patient transported to CT 

## 2017-03-03 NOTE — ED Provider Notes (Signed)
[facility]-EMERGENCY DEPT Provider Note   CSN: [id number] Arrival date & time: 03/03/17  1325     History   Chief Complaint Chief Complaint  Patient presents with  . raised area on clavicle    HPI Brooke Giles is a 76 y.o. female.Patient noted swollen area at right sternoclavicular joint noted one week ago. There is not painful. She denies dyspnea or dysphagia denies fever is asymptomatic she saw Dr Sharyn LullHarwani today in his office who recommended that she come to the emergency department.. No treatment prior to coming here no other associated symptoms. Nothing makes swelling better or worse. She does not feel ill  HPI  Past Medical History:  Diagnosis Date  . Type II or unspecified type diabetes mellitus without mention of complication, not stated as uncontrolled 08/16/2011  . Unspecified constipation 08/16/2011    Patient Active Problem List   Diagnosis Date Noted  . DM type 2 (diabetes mellitus, type 2) (HCC) 02/27/2013  . Other malaise and fatigue 02/27/2013  . Unspecified constipation 02/27/2013    Past Surgical History:  Procedure Laterality Date  . COLON SURGERY  2009   benign mass  . KNEE SURGERY  1979  . SALIVARY GLAND SURGERY  2009    OB History    No data available       Home Medications    Prior to Admission medications   Medication Sig Start Date End Date Taking? Authorizing Provider  aspirin 81 MG tablet Take 81 mg by mouth daily. Take 1 tablet daily to prevent heart attack, stroke.   Yes [provider]  glipiZIDE (GLUCOTROL) 10 MG tablet Take one tablet by mouth twice daily to control blood sugar   Yes [provider]  metFORMIN (GLUCOPHAGE) 1000 MG tablet Take 1,000 mg by mouth 2 (two) times daily with a meal.   Yes [provider]    Family History Family History  Problem Relation Age of Onset  . Anuerysm Mother   . Heart disease Father   . Heart disease Son     Social History Social History  Substance Use Topics  .  Smoking status: Never Smoker  . Smokeless tobacco: Never Used  . Alcohol use No     Allergies   Patient has no known allergies.   Review of Systems Review of Systems  Constitutional: Negative.   HENT: Negative.   Respiratory: Negative.   Cardiovascular: Negative.   Gastrointestinal: Negative.   Musculoskeletal: Negative.   Skin: Negative.   Allergic/Immunologic: Positive for immunocompromised state.       Diabetic  Neurological: Negative.   Psychiatric/Behavioral: Negative.      Physical Exam Updated Vital Signs BP (!) 156/81 (BP Location: Left Arm)   Pulse 94   Temp 98.3 F (36.8 C) (Oral)   Resp 13   Ht 5\' 4"  (1.626 m)   Wt 68.9 kg (152 lb)   SpO2 100%   BMI 26.09 kg/m   Physical Exam  Constitutional: She appears well-developed and well-nourished. No distress.  HENT:  Head: Normocephalic and atraumatic.  Eyes: Conjunctivae are normal. Pupils are equal, round, and reactive to light.  Neck: Neck supple. No tracheal deviation present. No thyromegaly present.  Cardiovascular: Normal rate and regular rhythm.   No murmur heard. Pulmonary/Chest: Effort normal and breath sounds normal. She exhibits no tenderness.  Swelling at right sternoclavicular joint. There is nontender and nonfluctuant  Abdominal: Soft. Bowel sounds are normal. She exhibits no distension. There is no tenderness.  Musculoskeletal: Normal range of  motion. She exhibits no edema or tenderness.  Neurological: She is alert. Coordination normal.  Skin: Skin is warm and dry. No rash noted.  Psychiatric: She has a normal mood and affect.  Nursing note and vitals reviewed.    ED Treatments / Results  Labs (all labs ordered are listed, but only abnormal results are displayed) Labs Reviewed  BASIC METABOLIC PANEL  CBC WITH DIFFERENTIAL/PLATELET    EKG  EKG Interpretation None       Radiology No results found.  Procedures Procedures (including critical care time)  Medications Ordered  in ED Medications - No data to display Results for orders placed or performed during the hospital encounter of 03/03/17  Basic metabolic panel  Result Value Ref Range   Sodium 141 135 - 145 mmol/L   Potassium 4.1 3.5 - 5.1 mmol/L   Chloride 105 101 - 111 mmol/L   CO2 28 22 - 32 mmol/L   Glucose, Bld 267 (H) 65 - 99 mg/dL   BUN 15 6 - 20 mg/dL   Creatinine, Ser 1.61 0.44 - 1.00 mg/dL   Calcium 9.9 8.9 - 09.6 mg/dL   GFR calc non Af Amer >60 >60 mL/min   GFR calc Af Amer >60 >60 mL/min   Anion gap 8 5 - 15   No results found.  Initial Impression / Assessment and Plan / ED Course  I have reviewed the triage vital signs and the nursing notes.  Pertinent labs & imaging results that were available during my care of the patient were reviewed by me and considered in my medical decision making (see chart for details).     450P.m. patient resting comfortably. Signed out to Dr. Freida Busman  Final Clinical Impressions(s) / ED Diagnoses   Final diagnoses:  None    New Prescriptions New Prescriptions   No medications on file     Doug Sou, MD 03/03/17 1723

## 2017-03-03 NOTE — ED Notes (Signed)
Pt verbalized understanding of discharge instructions and denies any further questions at this time.   

## 2017-03-03 NOTE — Discharge Instructions (Signed)
Follow-up with your Dr. next week. Return here for any trouble breathing

## 2017-09-13 ENCOUNTER — Encounter (HOSPITAL_COMMUNITY): Payer: Self-pay | Admitting: Emergency Medicine

## 2017-09-13 ENCOUNTER — Emergency Department (HOSPITAL_COMMUNITY)
Admission: EM | Admit: 2017-09-13 | Discharge: 2017-09-13 | Disposition: A | Discharge status: Home / Self Care | Payer: Medicare Other | Attending: Emergency Medicine | Admitting: Emergency Medicine

## 2017-09-13 DIAGNOSIS — H6691 Otitis media, unspecified, right ear: Secondary | ICD-10-CM | POA: Insufficient documentation

## 2017-09-13 DIAGNOSIS — Z7982 Long term (current) use of aspirin: Secondary | ICD-10-CM | POA: Diagnosis not present

## 2017-09-13 DIAGNOSIS — Z7984 Long term (current) use of oral hypoglycemic drugs: Secondary | ICD-10-CM | POA: Diagnosis not present

## 2017-09-13 DIAGNOSIS — H6123 Impacted cerumen, bilateral: Secondary | ICD-10-CM | POA: Diagnosis not present

## 2017-09-13 DIAGNOSIS — E119 Type 2 diabetes mellitus without complications: Secondary | ICD-10-CM | POA: Insufficient documentation

## 2017-09-13 DIAGNOSIS — H9203 Otalgia, bilateral: Secondary | ICD-10-CM | POA: Diagnosis present

## 2017-09-13 MED ORDER — AMOXICILLIN-POT CLAVULANATE 875-125 MG PO TABS: 1.0000 | ORAL_TABLET | Freq: Two times a day (BID) | ORAL | 0 refills | Status: AC

## 2017-09-13 NOTE — ED Triage Notes (Signed)
Patient reports had left ear pain for 2 weeks and now right ear starting to hurt so patient thought best be checked out. States when she talks sounds like she is in tunnel.

## 2017-09-13 NOTE — ED Provider Notes (Signed)
[facility]-EMERGENCY DEPT Provider Note   CSN: [id number] Arrival date & time: 09/13/17  0759     History   Chief Complaint Chief Complaint  Patient presents with  . Otalgia    left     HPI Brooke Giles is a 77 y.o. female.  HPI   Presents with bilateral ear pain, sensation of ears being blocked, like trying to listen to things in a tunnel.  Reports aching an pressure. Has been present for 2 weeks. Also has had nasal congestion with it. No fevers, no cough.    Past Medical History:  Diagnosis Date  . Type II or unspecified type diabetes mellitus without mention of complication, not stated as uncontrolled 08/16/2011  . Unspecified constipation 08/16/2011    Patient Active Problem List   Diagnosis Date Noted  . DM type 2 (diabetes mellitus, type 2) (HCC) 02/27/2013  . Other malaise and fatigue 02/27/2013  . Unspecified constipation 02/27/2013    Past Surgical History:  Procedure Laterality Date  . COLON SURGERY  2009   benign mass  . KNEE SURGERY  1979  . SALIVARY GLAND SURGERY  2009    OB History    No data available       Home Medications    Prior to Admission medications   Medication Sig Start Date End Date Taking? Authorizing Provider  aspirin 81 MG tablet Take 81 mg by mouth daily. Take 1 tablet daily to prevent heart attack, stroke.   Yes [provider]  glipiZIDE (GLUCOTROL XL) 2.5 MG 24 hr tablet Take 2.5 mg by mouth daily with breakfast.   Yes [provider]  ibuprofen (ADVIL,MOTRIN) 800 MG tablet Take 800 mg by mouth 3 (three) times daily as needed for moderate pain.   Yes [provider]  metFORMIN (GLUCOPHAGE) 500 MG tablet Take 500 mg by mouth daily.   Yes [provider]  amoxicillin-clavulanate (AUGMENTIN) 875-125 MG tablet Take 1 tablet by mouth every 12 (twelve) hours for 7 days. 09/13/17 09/20/17  Alvira Monday, MD    Family History Family History  Problem Relation Age of Onset    . Anuerysm Mother   . Heart disease Father   . Heart disease Son     Social History Social History   Tobacco Use  . Smoking status: Never Smoker  . Smokeless tobacco: Never Used  Substance Use Topics  . Alcohol use: No  . Drug use: No     Allergies   Patient has no known allergies.   Review of Systems Review of Systems  HENT: Positive for congestion and ear pain. Negative for ear discharge.      Physical Exam Updated Vital Signs BP 140/77   Pulse 64   Temp 98 F (36.7 C) (Oral)   Resp 15   SpO2 100%   Physical Exam  Constitutional: She is oriented to person, place, and time. She appears well-developed and well-nourished. No distress.  HENT:  Head: Normocephalic and atraumatic.  Mouth/Throat: Oropharynx is clear and moist.  Bilateral cerumen impaction After removal of cerumen, right ear with bulging TM Left ear still unable to visualize TM, inferior ear canal with white waxy appearance, no sign of discharge  Eyes: Conjunctivae and EOM are normal.  Neck: Normal range of motion.  Cardiovascular: Normal rate, regular rhythm and intact distal pulses.  Pulmonary/Chest: Effort normal. No respiratory distress.  Musculoskeletal: She exhibits no edema or tenderness.  Neurological: She is alert and oriented to person, place, and time.  Skin: Skin is warm and dry. No rash noted. She is not diaphoretic. No erythema.  Nursing note and vitals reviewed.    ED Treatments / Results  Labs (all labs ordered are listed, but only abnormal results are displayed) Labs Reviewed - No data to display  EKG  EKG Interpretation None       Radiology No results found.  Procedures .Ear Cerumen Removal Date/Time: 09/13/2017 9:09 PM Performed by: Alvira MondaySchlossman, Jarryn Altland, MD Authorized by: Alvira MondaySchlossman, Allister Lessley, MD   Consent:    Consent obtained:  Verbal   Consent given by:  Patient Procedure details:    Location:  L ear and R ear   Procedure type: curette     Procedure type  comment:  Irrigation performed by nursing, i performed with curette Post-procedure details:    Hearing quality:  Improved   Patient tolerance of procedure:  Tolerated well, no immediate complications   (including critical care time)  Medications Ordered in ED Medications - No data to display   Initial Impression / Assessment and Plan / ED Course  I have reviewed the triage vital signs and the nursing notes.  Pertinent labs & imaging results that were available during my care of the patient were reviewed by me and considered in my medical decision making (see chart for details).     77yo female presenting with ear discomfort and altered hearing.  Patient with bilateral cerumen impaction likely contributing to symptoms. Cerumen removed, with right ear showing bulging TM, will cover with augmentin.  Left ear with large amount of cerumen removed, however remaining is obstructing TM.  Abnormal appearance to inferior canal, waxy white appearance. Recommend follow up with ENT and PCP.  Patient discharged in stable condition with understanding of reasons to return.   Final Clinical Impressions(s) / ED Diagnoses   Final diagnoses:  Bilateral impacted cerumen  Right otitis media, unspecified otitis media type    ED Discharge Orders        Ordered    amoxicillin-clavulanate (AUGMENTIN) 875-125 MG tablet  Every 12 hours     09/13/17 1416    Ambulatory referral to ENT     09/13/17 2055       Alvira MondaySchlossman, Malaka Ruffner, MD 09/13/17 2118

## 2017-12-25 ENCOUNTER — Ambulatory Visit: Payer: Medicare Other | Admitting: Family Medicine

## 2017-12-28 ENCOUNTER — Encounter: Payer: Self-pay | Admitting: Family Medicine

## 2017-12-28 ENCOUNTER — Ambulatory Visit (INDEPENDENT_AMBULATORY_CARE_PROVIDER_SITE_OTHER): Payer: Medicare Other | Admitting: Family Medicine

## 2017-12-28 VITALS — BP 122/78 | HR 77 | Ht 64.0 in | Wt 164.5 lb

## 2017-12-28 DIAGNOSIS — H6122 Impacted cerumen, left ear: Secondary | ICD-10-CM | POA: Diagnosis not present

## 2017-12-28 DIAGNOSIS — E118 Type 2 diabetes mellitus with unspecified complications: Secondary | ICD-10-CM

## 2017-12-28 DIAGNOSIS — I10 Essential (primary) hypertension: Secondary | ICD-10-CM | POA: Diagnosis not present

## 2017-12-28 DIAGNOSIS — H409 Unspecified glaucoma: Secondary | ICD-10-CM | POA: Diagnosis not present

## 2017-12-28 LAB — URINALYSIS, ROUTINE W REFLEX MICROSCOPIC
BILIRUBIN URINE: NEGATIVE
Hgb urine dipstick: NEGATIVE
Ketones, ur: NEGATIVE
Nitrite: NEGATIVE
PH: 6 (ref 5.0–8.0)
RBC / HPF: NONE SEEN (ref 0–?)
SPECIFIC GRAVITY, URINE: 1.015 (ref 1.000–1.030)
TOTAL PROTEIN, URINE-UPE24: NEGATIVE
Urine Glucose: NEGATIVE
Urobilinogen, UA: 0.2 (ref 0.0–1.0)

## 2017-12-28 LAB — COMPREHENSIVE METABOLIC PANEL
ALK PHOS: 50 U/L (ref 39–117)
ALT: 15 U/L (ref 0–35)
AST: 16 U/L (ref 0–37)
Albumin: 4.2 g/dL (ref 3.5–5.2)
BILIRUBIN TOTAL: 0.7 mg/dL (ref 0.2–1.2)
BUN: 16 mg/dL (ref 6–23)
CO2: 27 mEq/L (ref 19–32)
Calcium: 9.7 mg/dL (ref 8.4–10.5)
Chloride: 105 mEq/L (ref 96–112)
Creatinine, Ser: 0.78 mg/dL (ref 0.40–1.20)
GFR: 91.33 mL/min (ref >60.00)
GLUCOSE: 157 mg/dL — AB (ref 70–99)
POTASSIUM: 4.5 meq/L (ref 3.5–5.1)
SODIUM: 141 meq/L (ref 135–145)
Total Protein: 7 g/dL (ref 6.0–8.3)

## 2017-12-28 LAB — MICROALBUMIN / CREATININE URINE RATIO
Creatinine,U: 68.5 mg/dL
MICROALB/CREAT RATIO: 1 mg/g (ref 0.0–30.0)
Microalb, Ur: <0.7 mg/dL (ref 0.0–1.9)

## 2017-12-28 LAB — CBC
HCT: 37.5 % (ref 36.0–46.0)
Hemoglobin: 12.4 g/dL (ref 12.0–15.0)
MCHC: 33 g/dL (ref 30.0–36.0)
MCV: 87.6 fl (ref 78.0–100.0)
Platelets: 280 10*3/uL (ref 150.0–400.0)
RBC: 4.28 Mil/uL (ref 3.87–5.11)
RDW: 14.3 % (ref 11.5–15.5)
WBC: 4.6 10*3/uL (ref 4.0–10.5)

## 2017-12-28 LAB — LIPID PANEL
CHOL/HDL RATIO: 3
Cholesterol: 182 mg/dL (ref 0–200)
HDL: 61.9 mg/dL (ref >39.00)
LDL Cholesterol: 93 mg/dL (ref 0–99)
NONHDL: 120.2
Triglycerides: 137 mg/dL (ref 0.0–149.0)
VLDL: 27.4 mg/dL (ref 0.0–40.0)

## 2017-12-28 LAB — HEMOGLOBIN A1C: Hgb A1c MFr Bld: 8.7 % — ABNORMAL HIGH (ref 4.6–6.5)

## 2017-12-28 MED ORDER — LOSARTAN POTASSIUM 50 MG PO TABS: 50.0000 mg | ORAL_TABLET | Freq: Every day | ORAL | 3 refills | Status: DC

## 2017-12-28 MED ORDER — CARBAMIDE PEROXIDE 6.5 % OT SOLN: 5.0000 [drp] | Freq: Two times a day (BID) | OTIC | 3 refills | Status: DC

## 2017-12-28 MED ORDER — METFORMIN HCL ER 500 MG PO TB24: ORAL_TABLET | ORAL | 1 refills | Status: DC

## 2017-12-28 NOTE — Addendum Note (Signed)
Addended by: Andrez Grime on: 12/28/2017 02:25 PM   Modules accepted: Orders

## 2017-12-28 NOTE — Progress Notes (Addendum)
Subjective:  Patient ID: Brooke Giles, female    DOB: 09/27/1939  Age: 77 y.o. MRN: [id number]  CC: Establish Care   HPI Brooke Giles presents for establishment of care and follow-up of her diabetes, hypertension glaucoma and ceruminosis.  Chart review shows that her diabetes has been under suboptimal control.  She is currently taking metformin 500 twice daily.  She had been taking glipizide 2.5 mg XL but is run out of that medicine sometime ago.  Her blood pressure is been controlled with losartan 50 mg daily.  She is seeing Dr. Franne Grip regularly for glaucoma and that is been well controlled she tells me.  She has had a history of ceruminosis and is not taking any medicines for that at this time.  She does not smoke drink or use illicit drugs.  She lives alone.  She retired from [employer].  Her children are scattered throughout the country but she does have a daughter who lives in [place].  She has no history of heart attack or stroke.  History Misk has a past medical history of Type II or unspecified type diabetes mellitus without mention of complication, not stated as uncontrolled (08/16/2011) and Unspecified constipation (08/16/2011).   She has a past surgical history that includes Knee surgery (1979); Salivary gland surgery (2009); and Colon surgery (2009).   Her family history includes Anuerysm in her mother; Heart disease in her father and son.She reports that she has never smoked. She has never used smokeless tobacco. She reports that she does not drink alcohol or use drugs.  Outpatient Medications Prior to Visit  Medication Sig Dispense Refill  . aspirin 81 MG tablet Take 81 mg by mouth daily. Take 1 tablet daily to prevent heart attack, stroke.    . latanoprost (XALATAN) 0.005 % ophthalmic solution 1 drop at bedtime.    Marland Kitchen ibuprofen (ADVIL,MOTRIN) 800 MG tablet Take 800 mg by mouth 3 (three) times daily as needed for moderate pain.    Marland Kitchen losartan (COZAAR) 50 MG tablet Take 50 mg by mouth  daily.    . metFORMIN (GLUCOPHAGE) 500 MG tablet Take 500 mg by mouth 2 (two) times daily with a meal.    . glipiZIDE (GLUCOTROL XL) 2.5 MG 24 hr tablet Take 2.5 mg by mouth daily with breakfast.    . metFORMIN (GLUCOPHAGE) 500 MG tablet Take 500 mg by mouth daily.     No facility-administered medications prior to visit.     ROS Review of Systems  Constitutional: Negative.  Negative for chills, fatigue, fever and unexpected weight change.  HENT: Negative.  Negative for congestion, postnasal drip and rhinorrhea.   Eyes: Negative for photophobia and visual disturbance.  Respiratory: Negative.   Cardiovascular: Negative.   Gastrointestinal: Negative.   Endocrine: Negative for polyphagia and polyuria.  Genitourinary: Negative.   Musculoskeletal: Negative for gait problem and myalgias.  Skin: Negative for pallor and rash.  Allergic/Immunologic: Negative for immunocompromised state.  Neurological: Negative for weakness and headaches.  Hematological: Does not bruise/bleed easily.  Psychiatric/Behavioral: Negative.     Objective:  BP 122/78   Pulse 77   Ht  (1.626 m)   Wt 164 lb 8 oz (74.6 kg)   SpO2 96%   BMI 28.24 kg/m   Physical Exam  Constitutional: She is oriented to person, place, and time. She appears well-developed and well-nourished. No distress.  HENT:  Head: Normocephalic and atraumatic.  Right Ear: Tympanic membrane and external ear normal.  Left Ear: Tympanic membrane and external  ear normal.  Ears:  Mouth/Throat: Oropharynx is clear and moist. No oropharyngeal exudate.  Eyes: Pupils are equal, round, and reactive to light. Conjunctivae and EOM are normal. Right eye exhibits no discharge. Left eye exhibits no discharge. No scleral icterus.  Neck: Normal range of motion. Neck supple. No JVD present. No tracheal deviation present. No thyromegaly present.  Cardiovascular: Normal rate, regular rhythm and normal heart sounds.  Pulmonary/Chest: Effort normal and  breath sounds normal. No stridor. No respiratory distress. She has no wheezes. She has no rales.  Abdominal: Bowel sounds are normal.  Musculoskeletal: She exhibits no edema or tenderness.  Lymphadenopathy:    She has no cervical adenopathy.  Neurological: She is alert and oriented to person, place, and time.  Skin: Skin is warm and dry. She is not diaphoretic.  Psychiatric: She has a normal mood and affect. Her behavior is normal.      Assessment & Plan:   Brooke Giles was seen today for establish care.  Diagnoses and all orders for this visit:  Type 2 diabetes mellitus with complication, without long-term current use of insulin (HCC) -     CBC -     Comprehensive metabolic panel -     Hemoglobin A1c -     Lipid panel -     Urinalysis, Routine w reflex microscopic -     Microalbumin / creatinine urine ratio -     losartan (COZAAR) 50 MG tablet; Take 1 tablet (50 mg total) by mouth daily. -     metFORMIN (GLUCOPHAGE-XR) 500 MG 24 hr tablet; Take 2 tablets twice each day.  Essential hypertension -     CBC -     Comprehensive metabolic panel -     Urinalysis, Routine w reflex microscopic -     losartan (COZAAR) 50 MG tablet; Take 1 tablet (50 mg total) by mouth daily.  Glaucoma, unspecified glaucoma type, unspecified laterality  Excessive cerumen in left ear canal -     carbamide peroxide (DEBROX) 6.5 % OTIC solution; Place 5 drops into both ears 2 (two) times daily.   I have discontinued Brooke Giles's metFORMIN, glipiZIDE, ibuprofen, and metFORMIN. I have also changed her losartan. Additionally, I am having her start on carbamide peroxide and metFORMIN. Lastly, I am having her maintain her aspirin and latanoprost.  Meds ordered this encounter  Medications  . losartan (COZAAR) 50 MG tablet    Sig: Take 1 tablet (50 mg total) by mouth daily.    Dispense:  100 tablet    Refill:  3  . carbamide peroxide (DEBROX) 6.5 % OTIC solution    Sig: Place 5 drops into both ears 2 (two) times  daily.    Dispense:  15 mL    Refill:  3  . metFORMIN (GLUCOPHAGE-XR) 500 MG 24 hr tablet    Sig: Take 2 tablets twice each day.    Dispense:  360 tablet    Refill:  1   Patient will use the Debrox drops and follow-up to have her left ear irrigated.  Will not be restarting the glipizide if I can help it.  She is tolerating the metformin and will try to increase the dose of that her hemoglobin A1c warrants it.  Follow-up: Return in about 3 months (around 03/30/2018), or use ear drops and return for cerumin removal..  Mliss Sax, MD

## 2017-12-28 NOTE — Patient Instructions (Signed)
Ear Drops, Adult Your doctor has found that you have a condition that requires you to use ear drops. Ear drops are a medicine that is placed in the ear. This sheet gives you information about how to use this medicine. Your doctor may also give you more specific instructions. Supplies needed:  Cotton ball.  Medicine. How to put ear drops into your ear 1. Wash your hands with soap and water. 2. Make sure your ears are clean and dry. 3. Warm the medicine by holding it in your hand for a few minutes. 4. Shake the medicine to mix the ear drops. 5. Use the tube to get the medicine. You will need to squeeze the round part of the tube to do this. 6. Put the drops in your ear as told. Hold the tube above your ear. Do not let the tube touch your ear. The medicine may go in easier if you pull the flap of your ear up and back while you put the drops in. 7. To make sure your ear soaks up the medicine, do one of these things: ? Lie down for 10 minutes. The ear with the medicine should face up. ? Put a cotton ball in your ear. Do not push it deeper into your ear. Take out the cotton ball when the drops have been soaked up. 8. If you need to put drops in your other ear, repeat the same steps. Your doctor will tell you if you should put drops in both ears. Follow these instructions at home:  Use the ear drops for as long as your doctor tells you to. Do not stop even if your symptoms get better.  Keep the ear drops at room temperature.  Keep all follow-up visits as told by your doctor. This is important. Contact a doctor if:  Your condition gets worse.  Your pain gets worse.  Unusual fluid (drainage) is coming from your ear, especially if the fluid stinks.  You have trouble hearing. Get help right away if:  You feel like the room is spinning and you feel sick to your stomach. This condition is called vertigo.  The outside of your ear becomes red or swollen.  You have a very bad  headache. Summary  Ear drops are a medicine that is put in the ear.  Put the drops in your ear as told by your doctor.  Use the ear drops for as long as your doctor tells you to. Do not stop even if your symptoms get better. This information is not intended to replace advice given to you by your health care provider. Make sure you discuss any questions you have with your health care provider. Document Released: 02/02/2010 Document Revised: 08/19/2016 Document Reviewed: 08/19/2016 Elsevier Interactive Patient Education  2017 [employer]  Earwax Buildup, Adult The ears produce a substance called earwax that helps keep bacteria out of the ear and protects the skin in the ear canal. Occasionally, earwax can build up in the ear and cause discomfort or hearing loss. What increases the risk? This condition is more likely to develop in people who:  Are female.  Are elderly.  Naturally produce more earwax.  Clean their ears often with cotton swabs.  Use earplugs often.  Use in-ear headphones often.  Wear hearing aids.  Have narrow ear canals.  Have earwax that is overly thick or sticky.  Have eczema.  Are dehydrated.  Have excess hair in the ear canal.  What are the signs or symptoms? Symptoms  of this condition include:  Reduced or muffled hearing.  A feeling of fullness in the ear or feeling that the ear is plugged.  Fluid coming from the ear.  Ear pain.  Ear itch.  Ringing in the ear.  Coughing.  An obvious piece of earwax that can be seen inside the ear canal.  How is this diagnosed? This condition may be diagnosed based on:  Your symptoms.  Your medical history.  An ear exam. During the exam, your health care provider will look into your ear with an instrument called an otoscope.  You may have tests, including a hearing test. How is this treated? This condition may be treated by:  Using ear drops to soften the earwax.  Having the earwax removed  by a health care provider. The health care provider may: ? Flush the ear with water. ? Use an instrument that has a loop on the end (curette). ? Use a suction device.  Surgery to remove the wax buildup. This may be done in severe cases.  Follow these instructions at home:  Take over-the-counter and prescription medicines only as told by your health care provider.  Do not put any objects, including cotton swabs, into your ear. You can clean the opening of your ear canal with a washcloth or facial tissue.  Follow instructions from your health care provider about cleaning your ears. Do not over-clean your ears.  Drink enough fluid to keep your urine clear or pale yellow. This will help to thin the earwax.  Keep all follow-up visits as told by your health care provider. If earwax builds up in your ears often or if you use hearing aids, consider seeing your health care provider for routine, preventive ear cleanings. Ask your health care provider how often you should schedule your cleanings.  If you have hearing aids, clean them according to instructions from the manufacturer and your health care provider. Contact a health care provider if:  You have ear pain.  You develop a fever.  You have blood, pus, or other fluid coming from your ear.  You have hearing loss.  You have ringing in your ears that does not go away.  Your symptoms do not improve with treatment.  You feel like the room is spinning (vertigo). Summary  Earwax can build up in the ear and cause discomfort or hearing loss.  The most common symptoms of this condition include reduced or muffled hearing and a feeling of fullness in the ear or feeling that the ear is plugged.  This condition may be diagnosed based on your symptoms, your medical history, and an ear exam.  This condition may be treated by using ear drops to soften the earwax or by having the earwax removed by a health care provider.  Do not put any  objects, including cotton swabs, into your ear. You can clean the opening of your ear canal with a washcloth or facial tissue. This information is not intended to replace advice given to you by your health care provider. Make sure you discuss any questions you have with your health care provider. Document Released: 09/22/2004 Document Revised: 10/26/2016 Document Reviewed: 10/26/2016 Elsevier Interactive Patient Education  [employer].

## 2018-01-02 ENCOUNTER — Ambulatory Visit: Payer: Self-pay | Admitting: *Deleted

## 2018-01-02 NOTE — Telephone Encounter (Signed)
Patient had her medication doubled by provider and she states it was too much. Patient states she had been eating sweets and she thinks that is what made her levels high at the office. Patient started on the new dosing and she states she started feeling sick and fatigued. She checked her glucose level and it was 50 last night. She had to eat something. This morning she was 60. She feels that taking the Metformin 500 mg 2 am/ 2 pm is too much and is making her sick and too low. Patient states she is going back to her original dosing. Patient glucose level now is 107.  Patient's best contact # [phone]. Told patient I would let her provider know and they would contact her with new instructions.  Reason for Disposition . [1] Morning (before breakfast) blood glucose < 80 mg/dl (4.5 mmol/L) AND [6] more than once in past week  Answer Assessment - Initial Assessment Questions 1. SYMPTOMS: "What symptoms are you concerned about?"     Patient felt bad and felt like she was going to pass out- weak 2. ONSET:  "When did the symptoms start?"     Today 3. BLOOD GLUCOSE: "What is your blood glucose level?"      60- today 4. USUAL RANGE: "What is your blood glucose level usually?" (e.g., usual fasting morning value, usual evening value)     120/130 is usually the highest 5. TYPE 1 or 2:  "Do you know what type of diabetes you have?"  (e.g., Type 1, Type 2, Gestational; doesn't know)      Type 2 6. INSULIN: "Do you take insulin?"      no 7. DIABETES PILLS: "Do you take any pills for your diabetes?"     metformin 8. OTHER SYMPTOMS: "Do you have any symptoms?" (e.g., fever, frequent urination, difficulty breathing, vomiting)     Weakness- faint 9. LOW BLOOD GLUCOSE TREATMENT: "What have you done so far to treat the low blood glucose level?"     Reading of 50 last night- patient started feeling bad- patient ate some pears 10. ALONE: "Are you alone right now or is someone with you?"        Patient lives  alone 11. PREGNANCY: "Is there any chance you are pregnant?" "When was your last menstrual period?"       n/a  Protocols used: DIABETES - LOW BLOOD SUGAR-A-AH

## 2018-01-04 NOTE — Telephone Encounter (Signed)
Have pt rtc.

## 2018-01-04 NOTE — Telephone Encounter (Signed)
I called and spoke with patient. Her blood sugars have been running in the 120's since she went back to how she previously took her metformin. Appointment scheduled for 5/13 at 9am.

## 2018-01-08 ENCOUNTER — Ambulatory Visit: Payer: Medicare Other | Admitting: Family Medicine

## 2018-01-09 ENCOUNTER — Ambulatory Visit (INDEPENDENT_AMBULATORY_CARE_PROVIDER_SITE_OTHER): Payer: Medicare Other | Admitting: Family Medicine

## 2018-01-09 ENCOUNTER — Encounter: Payer: Self-pay | Admitting: Family Medicine

## 2018-01-09 VITALS — BP 132/80 | HR 81 | Ht 64.0 in | Wt 164.1 lb

## 2018-01-09 DIAGNOSIS — H6123 Impacted cerumen, bilateral: Secondary | ICD-10-CM

## 2018-01-09 DIAGNOSIS — E118 Type 2 diabetes mellitus with unspecified complications: Secondary | ICD-10-CM

## 2018-01-09 MED ORDER — PIOGLITAZONE HCL 15 MG PO TABS: 15.0000 mg | ORAL_TABLET | ORAL | 2 refills | Status: DC

## 2018-01-09 NOTE — Progress Notes (Signed)
Subjective:  Patient ID: Brooke Giles, female    DOB: 09/27/1939  Age: 77 y.o. MRN: [id number]  CC: Follow-up (Diabetes, last A1C was 8.7, unable to tolerate the increase in dosage of the Metformin. Sugar this AM was 128.)   HPI Brooke Giles presents for follow-up of her suboptimally controlled diabetes.  She did not tolerate the increase Glucophage dosage of the thousand milligrams twice daily.  She told me that it dropped her blood sugar.  She denied other side effects.  This being the case Glucophage is not likely the cause of her low blood sugars.  She is no longer taking the glipizide.  She does admit to dietary indiscretions.  However she is exercising some by walking at night and has been back to the gym.  She has been using the Debrox drops in her ears continue to bother her.  Outpatient Medications Prior to Visit  Medication Sig Dispense Refill  . aspirin 81 MG tablet Take 81 mg by mouth daily. Take 1 tablet daily to prevent heart attack, stroke.    . carbamide peroxide (DEBROX) 6.5 % OTIC solution Place 5 drops into both ears 2 (two) times daily. 15 mL 3  . latanoprost (XALATAN) 0.005 % ophthalmic solution 1 drop at bedtime.    Marland Kitchen losartan (COZAAR) 50 MG tablet Take 1 tablet (50 mg total) by mouth daily. 100 tablet 3  . metFORMIN (GLUCOPHAGE-XR) 500 MG 24 hr tablet Take 2 tablets twice each day. 360 tablet 1   No facility-administered medications prior to visit.     ROS Review of Systems  Constitutional: Negative.   HENT: Positive for ear pain and hearing loss.   Eyes: Negative for photophobia and visual disturbance.  Respiratory: Negative.   Cardiovascular: Negative.   Gastrointestinal: Negative.   Endocrine: Negative for polyphagia and polyuria.  Genitourinary: Negative for frequency and urgency.  Allergic/Immunologic: Negative for immunocompromised state.  Neurological: Negative for weakness and headaches.  Hematological: Does not bruise/bleed easily.    Psychiatric/Behavioral: Negative.     Objective:  BP 132/80   Pulse 81   Ht  (1.626 m)   Wt 164 lb 2 oz (74.4 kg)   SpO2 99%   BMI 28.17 kg/m   BP Readings from Last 3 Encounters:  01/09/18 132/80  12/28/17 122/78  09/13/17 140/77    Wt Readings from Last 3 Encounters:  01/09/18 164 lb 2 oz (74.4 kg)  12/28/17 164 lb 8 oz (74.6 kg)  03/03/17 152 lb (68.9 kg)    Physical Exam  Constitutional: She is oriented to person, place, and time. She appears well-developed and well-nourished. No distress.  HENT:  Head: Normocephalic and atraumatic.  Right Ear: External ear normal. A foreign body is present.  Left Ear: External ear normal. A foreign body is present.  Nose: Nose normal.  Mouth/Throat: Oropharynx is clear and moist. No oropharyngeal exudate.  Eyes: Conjunctivae are normal. Right eye exhibits no discharge. Left eye exhibits no discharge. No scleral icterus.  Pulmonary/Chest: Effort normal.  Musculoskeletal:       Right lower leg: She exhibits no edema.       Left lower leg: She exhibits no edema.  Neurological: She is alert and oriented to person, place, and time.  Skin: Skin is warm and dry. She is not diaphoretic.  Psychiatric: She has a normal mood and affect. Her behavior is normal.    Lab Results  Component Value Date   WBC 4.6 12/28/2017   HGB 12.4 12/28/2017  HCT 37.5 12/28/2017   PLT 280.0 12/28/2017   GLUCOSE 157 (H) 12/28/2017   CHOL 182 12/28/2017   TRIG 137.0 12/28/2017   HDL 61.90 12/28/2017   LDLCALC 93 12/28/2017   ALT 15 12/28/2017   AST 16 12/28/2017   NA 141 12/28/2017   K 4.5 12/28/2017   CL 105 12/28/2017   CREATININE 0.78 12/28/2017   BUN 16 12/28/2017   CO2 27 12/28/2017   TSH 1.010 02/27/2013   HGBA1C 8.7 (H) 12/28/2017   MICROALBUR <0.7 12/28/2017    No results found.  Assessment & Plan:   Cing was seen today for follow-up.  Diagnoses and all orders for this visit:  Excessive cerumen in both ear canals -      Ambulatory referral to ENT  Type 2 diabetes mellitus with complication, without long-term current use of insulin (HCC) -     pioglitazone (ACTOS) 15 MG tablet; Take 1 tablet (15 mg total) by mouth every morning.   I am having Brooke Giles start on pioglitazone. I am also having her maintain her aspirin, latanoprost, losartan, carbamide peroxide, and metFORMIN.  Meds ordered this encounter  Medications  . pioglitazone (ACTOS) 15 MG tablet    Sig: Take 1 tablet (15 mg total) by mouth every morning.    Dispense:  30 tablet    Refill:  2   We will start the patient on low-dose Actos and hopefully she will tolerate this.  She has no history of congestive heart failure.  She does not have edema.  She will continue her Metformin twice daily.  Advised her to avoid all sweets and to continue her exercise regimen.  Weight loss would be of great benefit.  We will follow her up in 3 months.  Follow-up: Return in about 3 months (around 04/11/2018).  Mliss Sax, MD

## 2018-01-09 NOTE — Patient Instructions (Signed)
Type 2 Diabetes Mellitus, Self Care, Adult When you have type 2 diabetes (type 2 diabetes mellitus), you must keep your blood sugar (glucose) under control. You can do this with:  Nutrition.  Exercise.  Lifestyle changes.  Medicines or insulin, if needed.  Support from your doctors and others.  How do I manage my blood sugar?  Check your blood sugar level every day, as often as told.  Call your doctor if your blood sugar is above your goal numbers for 2 tests in a row.  Have your A1c (hemoglobin A1c) level checked at least two times a year. Have it checked more often if your doctor tells you to. Your doctor will set treatment goals for you. Generally, you should have these blood sugar levels:  Before meals (preprandial): 80-130 mg/dL (4.4-7.2 mmol/L).  After meals (postprandial): lower than 180 mg/dL (10 mmol/L).  A1c level: less than 7%.  What do I need to know about high blood sugar? High blood sugar is called hyperglycemia. Know the signs of high blood sugar. Signs may include:  Feeling: ? Thirsty. ? Hungry. ? Very tired.  Needing to pee (urinate) more than usual.  Blurry vision.  What do I need to know about low blood sugar? Low blood sugar is called hypoglycemia. This is when blood sugar is at or below 70 mg/dL (3.9 mmol/L). Symptoms may include:  Feeling: ? Hungry. ? Worried or nervous (anxious). ? Sweaty and clammy. ? Confused. ? Dizzy. ? Sleepy. ? Sick to your stomach (nauseous).  Having: ? A fast heartbeat (palpitations). ? A headache. ? A change in your vision. ? Jerky movements that you cannot control (seizure). ? Nightmares. ? Tingling or no feeling (numbness) around the mouth, lips, or tongue.  Having trouble with: ? Talking. ? Paying attention (concentrating). ? Moving (coordination). ? Sleeping.  Shaking.  Passing out (fainting).  Getting upset easily (irritability).  Treating low blood sugar  To treat low blood sugar, eat or  drink something sugary right away. If you can think clearly and swallow safely, follow the 15:15 rule:  Take 15 grams of a fast-acting carb (carbohydrate). Some fast-acting carbs are: ? 1 tube of glucose gel. ? 3 sugar tablets (glucose pills). ? 6-8 pieces of hard candy. ? 4 oz (120 mL) of fruit juice. ? 4 oz (120 mL) regular (not diet) soda.  Check your blood sugar 15 minutes after you take the carb.  If your blood sugar is still at or below 70 mg/dL (3.9 mmol/L), take 15 grams of a carb again.  If your blood sugar does not go above 70 mg/dL (3.9 mmol/L) after 3 tries, get help right away.  After your blood sugar goes back to normal, eat a meal or a snack within 1 hour.  Treating very low blood sugar If your blood sugar is at or below 54 mg/dL (3 mmol/L), you have very low blood sugar (severe hypoglycemia). This is an emergency. Do not wait to see if the symptoms will go away. Get medical help right away. Call your local emergency services (911 in the [place] Do not drive yourself to the hospital. If you have very low blood sugar and you cannot eat or drink, you may need a glucagon shot (injection). A family member or friend should learn how to check your blood sugar and how to give you a glucagon shot. Ask your doctor if you need to have a glucagon shot kit at home. What else is important to manage my diabetes? Medicine  Follow these instructions about insulin and diabetes medicines:  Take them as told by your doctor.  Adjust them as told by your doctor.  Do not run out of them.  Having diabetes can raise your risk for other long-term conditions. These include heart or kidney disease. Your doctor may prescribe medicines to help prevent problems from diabetes. Food   Make healthy food choices. These include: ? Chicken, fish, egg whites, and beans. ? Oats, whole wheat, bulgur, brown rice, quinoa, and millet. ? Fresh fruits and vegetables. ? Low-fat dairy products. ? Nuts,  avocado, olive oil, and canola oil.  Make a food plan with a specialist (dietitian).  Follow instructions from your doctor about what you cannot eat or drink.  Drink enough fluid to keep your pee (urine) clear or pale yellow.  Eat healthy snacks between healthy meals.  Keep track of carbs that you eat. Read food labels. Learn food serving sizes.  Follow your sick day plan when you cannot eat or drink normally. Make this plan with your doctor so it is ready to use. Activity  Exercise at least 3 times a week.  Do not go more than 2 days without exercising.  Talk with your doctor before you start a new exercise. Your doctor may need to adjust your insulin, medicines, or food. Lifestyle   Do not use any tobacco products. These include cigarettes, chewing tobacco, and e-cigarettes.If you need help quitting, ask your doctor.  Ask your doctor how much alcohol is safe for you.  Learn to deal with stress. If you need help with this, ask your doctor. Body care  Stay up to date with your shots (immunizations).  Have your eyes and feet checked by a doctor as often as told.  Check your skin and feet every day. Check for cuts, bruises, redness, blisters, or sores.  Brush your teeth and gums two times a day.  Floss at least one time a day.  Go to the dentist least one time every 6 months.  Stay at a healthy weight. General instructions   Take over-the-counter and prescription medicines only as told by your doctor.  Share your diabetes care plan with: ? Your work or school. ? People you live with.  Check your pee (urine) for ketones: ? When you are sick. ? As told by your doctor.  Carry a card or wear jewelry that says that you have diabetes.  Ask your doctor: ? Do I need to meet with a diabetes educator? ? Where can I find a support group for people with diabetes?  Keep all follow-up visits as told by your doctor. This is important. Where to find more information: To  learn more about diabetes, visit:  American Diabetes Association: www.diabetes.org  American Association of Diabetes Educators: www.diabeteseducator.org/patient-resources  This information is not intended to replace advice given to you by your health care provider. Make sure you discuss any questions you have with your health care provider. Document Released: 12/07/2015 Document Revised: 01/21/2016 Document Reviewed: 09/18/2015 Elsevier Interactive Patient Education  [employer].

## 2018-04-02 ENCOUNTER — Ambulatory Visit: Payer: Medicare Other | Admitting: Family Medicine

## 2018-06-05 ENCOUNTER — Emergency Department (HOSPITAL_COMMUNITY)
Admission: EM | Admit: 2018-06-05 | Discharge: 2018-06-05 | Disposition: A | Discharge status: Home / Self Care | Payer: Medicare Other | Attending: Emergency Medicine | Admitting: Emergency Medicine

## 2018-06-05 ENCOUNTER — Encounter (HOSPITAL_COMMUNITY): Payer: Self-pay

## 2018-06-05 ENCOUNTER — Emergency Department (HOSPITAL_COMMUNITY): Payer: Medicare Other

## 2018-06-05 DIAGNOSIS — E119 Type 2 diabetes mellitus without complications: Secondary | ICD-10-CM | POA: Insufficient documentation

## 2018-06-05 DIAGNOSIS — Z79899 Other long term (current) drug therapy: Secondary | ICD-10-CM | POA: Insufficient documentation

## 2018-06-05 DIAGNOSIS — I10 Essential (primary) hypertension: Secondary | ICD-10-CM | POA: Diagnosis not present

## 2018-06-05 DIAGNOSIS — R040 Epistaxis: Secondary | ICD-10-CM | POA: Insufficient documentation

## 2018-06-05 DIAGNOSIS — R0602 Shortness of breath: Secondary | ICD-10-CM | POA: Diagnosis present

## 2018-06-05 LAB — BASIC METABOLIC PANEL
Anion gap: 5 (ref 5–15)
BUN: 13 mg/dL (ref 8–23)
CO2: 26 mmol/L (ref 22–32)
CREATININE: 0.68 mg/dL (ref 0.44–1.00)
Calcium: 9.4 mg/dL (ref 8.9–10.3)
Chloride: 105 mmol/L (ref 98–111)
GFR calc Af Amer: >60 mL/min (ref >60)
Glucose, Bld: 316 mg/dL — ABNORMAL HIGH (ref 70–99)
Potassium: 4.2 mmol/L (ref 3.5–5.1)
SODIUM: 136 mmol/L (ref 135–145)

## 2018-06-05 LAB — CBC
HCT: 38.9 % (ref 36.0–46.0)
HEMOGLOBIN: 12.1 g/dL (ref 12.0–15.0)
MCH: 27.5 pg (ref 26.0–34.0)
MCHC: 31.1 g/dL (ref 30.0–36.0)
MCV: 88.4 fL (ref 80.0–100.0)
Platelets: 305 10*3/uL (ref 150–400)
RBC: 4.4 MIL/uL (ref 3.87–5.11)
RDW: 13.9 % (ref 11.5–15.5)
WBC: 4.8 10*3/uL (ref 4.0–10.5)

## 2018-06-05 LAB — I-STAT TROPONIN, ED: Troponin i, poc: 0 ng/mL (ref 0.00–0.08)

## 2018-06-05 MED ORDER — OXYMETAZOLINE HCL 0.05 % NA SOLN
1.0000 | Freq: Once | NASAL | Status: AC
  Administered 2018-06-05: 1 via NASAL
  Filled 2018-06-05: qty 15

## 2018-06-05 NOTE — ED Notes (Signed)
Pt ambulated on pulse ox with a steady gait. O2 saturation stayed between 100-98%. Pt did not complain of any shortness or breath when asked.

## 2018-06-05 NOTE — ED Notes (Signed)
PT states understanding of care given, follow up care, and medication prescribed. PT ambulated from ED to car with a steady gait. 

## 2018-06-05 NOTE — ED Notes (Signed)
ent cart at bedside 

## 2018-06-05 NOTE — ED Provider Notes (Signed)
[facility] EMERGENCY DEPARTMENT Provider Note   CSN: [id number] Arrival date & time: 06/05/18  2011     History   Chief Complaint Chief Complaint  Patient presents with  . Shortness of Breath    HPI Brooke Giles is a 77 y.o. female.  The history is provided by the patient. No language interpreter was used.  Shortness of Breath    Brooke Giles is a 77 y.o. female who presents to the Emergency Department complaining of epistaxis, sob. Since to the emergency department complaining of nose pain and shortness of breath and started about two hours prior to ED arrival. She was at rest when she developed bleeding from her left nare. It lasted about 15 minutes. She then felt slightly short of breath like it was difficult to catch her breath. The nose bleeding to stop on its own and then it restarted when she arrived to the emergency department. No prior similar symptoms. No recent illnesses. Denies any fevers, chest pain, cough, abdominal pain, nausea, vomiting. Past Medical History:  Diagnosis Date  . Type II or unspecified type diabetes mellitus without mention of complication, not stated as uncontrolled 08/16/2011  . Unspecified constipation 08/16/2011    Patient Active Problem List   Diagnosis Date Noted  . HTN (hypertension) 12/28/2017  . Glaucoma 12/28/2017  . Ceruminosis 12/28/2017  . DM type 2 (diabetes mellitus, type 2) (HCC) 02/27/2013  . Other malaise and fatigue 02/27/2013  . Unspecified constipation 02/27/2013    Past Surgical History:  Procedure Laterality Date  . COLON SURGERY  2009   benign mass  . KNEE SURGERY  1979  . SALIVARY GLAND SURGERY  2009     OB History   None      Home Medications    Prior to Admission medications   Medication Sig Start Date End Date Taking? Authorizing Provider  aspirin 81 MG tablet Take 81 mg by mouth daily. Take 1 tablet daily to prevent heart attack, stroke.    [provider]  carbamide  peroxide (DEBROX) 6.5 % OTIC solution Place 5 drops into both ears 2 (two) times daily. 12/28/17   Mliss Sax, MD  latanoprost (XALATAN) 0.005 % ophthalmic solution 1 drop at bedtime.    [provider]  losartan (COZAAR) 50 MG tablet Take 1 tablet (50 mg total) by mouth daily. 12/28/17   Mliss Sax, MD  metFORMIN (GLUCOPHAGE-XR) 500 MG 24 hr tablet Take 2 tablets twice each day. 12/28/17   Mliss Sax, MD  pioglitazone (ACTOS) 15 MG tablet Take 1 tablet (15 mg total) by mouth every morning. 01/09/18   Mliss Sax, MD    Family History Family History  Problem Relation Age of Onset  . Anuerysm Mother   . Heart disease Father   . Heart disease Son     Social History Social History   Tobacco Use  . Smoking status: Never Smoker  . Smokeless tobacco: Never Used  Substance Use Topics  . Alcohol use: No  . Drug use: No     Allergies   Patient has no known allergies.   Review of Systems Review of Systems  Respiratory: Positive for shortness of breath.   All other systems reviewed and are negative.    Physical Exam Updated Vital Signs BP (!) 154/84 (BP Location: Right Arm)   Pulse 75   Temp 97.6 F (36.4 C) (Oral)   Resp 14   Ht 5\' 4"  (1.626 m)   Wt  68.9 kg   SpO2 100%   BMI 26.09 kg/m   Physical Exam  Constitutional: She is oriented to person, place, and time. She appears well-developed and well-nourished.  HENT:  Head: Normocephalic and atraumatic.  Small amount of blood in the left anterior septum. No blood in the posterior oropharynx.  Cardiovascular: Normal rate and regular rhythm.  No murmur heard. Pulmonary/Chest: Effort normal and breath sounds normal. No respiratory distress.  Abdominal: Soft. There is no tenderness. There is no rebound and no guarding.  Musculoskeletal: She exhibits no edema or tenderness.  Neurological: She is alert and oriented to person, place, and time.  Skin: Skin is warm and dry.    Psychiatric: She has a normal mood and affect. Her behavior is normal.  Nursing note and vitals reviewed.    ED Treatments / Results  Labs (all labs ordered are listed, but only abnormal results are displayed) Labs Reviewed  CBC  BASIC METABOLIC PANEL  I-STAT TROPONIN, ED    EKG EKG Interpretation  Date/Time:  Tuesday June 05 2018 20:19:29 EDT Ventricular Rate:  73 PR Interval:    QRS Duration: 95 QT Interval:  419 QTC Calculation: 462 R Axis:   -18 Text Interpretation:  Sinus rhythm Left ventricular hypertrophy no prior available for comparison Confirmed by Tilden Fossa [phone]) on 06/05/2018 9:11:57 PM   Radiology Dg Chest 2 View  Result Date: 06/05/2018 CLINICAL DATA:  Acute shortness of breath today. EXAM: CHEST - 2 VIEW COMPARISON:  03/03/2017 chest CT FINDINGS: The cardiomediastinal silhouette is unremarkable. There is no evidence of focal airspace disease, pulmonary edema, suspicious pulmonary nodule/mass, pleural effusion, or pneumothorax. No acute bony abnormalities are identified. IMPRESSION: No active cardiopulmonary disease. Electronically Signed   By: Harmon Pier M.D.   On: 06/05/2018 20:58    Procedures .Epistaxis Management Date/Time: 06/05/2018 11:21 PM Performed by: Tilden Fossa, MD Authorized by: Tilden Fossa, MD   Consent:    Consent obtained:  Verbal   Consent given by:  Patient   Risks discussed:  Bleeding, nasal injury and pain Anesthesia (see MAR for exact dosages):    Anesthesia method:  None Procedure details:    Treatment site:  L anterior   Treatment method:  Silver nitrate   Treatment complexity:  Limited   Treatment episode: initial   Post-procedure details:    Assessment:  Bleeding stopped   (including critical care time)  Medications Ordered in ED Medications  oxymetazoline (AFRIN) 0.05 % nasal spray 1 spray (has no administration in time range)     Initial Impression / Assessment and Plan / ED Course  I have  reviewed the triage vital signs and the nursing notes.  Pertinent labs & imaging results that were available during my care of the patient were reviewed by me and considered in my medical decision making (see chart for details).     Pt here for evaluation of epistaxis and shortness of breath. She does have a small amount of bleeding from her left anterior septum on ED arrival. She was treated with Afrin followed by silver nitrate. She was observed without recurrent bleeding in the emergency department. Lungs are clear on examination with no acute respiratory distress. Presentation is not consistent with CHF, PE, pneumonia, aspiration. Discussed with patient home care for epistaxis as well as episode of shortness of breath, now resolved. Discussed outpatient follow-up and return precautions.  Final Clinical Impressions(s) / ED Diagnoses   Final diagnoses:  Epistaxis    ED Discharge Orders  None       Tilden Fossa, MD 06/05/18 2322

## 2018-06-05 NOTE — ED Triage Notes (Signed)
EMS originally called for nose bleed which was undercontrol upon arrival.  Pt was anxious bout heart disease b/c both parents died of heart issues.  Pt SOB but again was regular and unlabored upon arrival.  CBG was 355

## 2018-06-12 ENCOUNTER — Ambulatory Visit (INDEPENDENT_AMBULATORY_CARE_PROVIDER_SITE_OTHER): Payer: Medicare Other | Admitting: Family Medicine

## 2018-06-12 ENCOUNTER — Encounter: Payer: Self-pay | Admitting: Family Medicine

## 2018-06-12 VITALS — BP 128/80 | HR 79 | Temp 97.8°F | Ht 64.0 in | Wt 156.1 lb

## 2018-06-12 DIAGNOSIS — H409 Unspecified glaucoma: Secondary | ICD-10-CM | POA: Diagnosis not present

## 2018-06-12 DIAGNOSIS — H6122 Impacted cerumen, left ear: Secondary | ICD-10-CM

## 2018-06-12 DIAGNOSIS — E119 Type 2 diabetes mellitus without complications: Secondary | ICD-10-CM

## 2018-06-12 DIAGNOSIS — I10 Essential (primary) hypertension: Secondary | ICD-10-CM

## 2018-06-12 DIAGNOSIS — Z87898 Personal history of other specified conditions: Secondary | ICD-10-CM

## 2018-06-12 DIAGNOSIS — E118 Type 2 diabetes mellitus with unspecified complications: Secondary | ICD-10-CM

## 2018-06-12 LAB — COMPREHENSIVE METABOLIC PANEL
ALT: 14 U/L (ref 0–35)
AST: 13 U/L (ref 0–37)
Albumin: 4.5 g/dL (ref 3.5–5.2)
Alkaline Phosphatase: 60 U/L (ref 39–117)
BILIRUBIN TOTAL: 0.7 mg/dL (ref 0.2–1.2)
BUN: 22 mg/dL (ref 6–23)
CO2: 30 meq/L (ref 19–32)
Calcium: 10.2 mg/dL (ref 8.4–10.5)
Chloride: 101 mEq/L (ref 96–112)
Creatinine, Ser: 0.78 mg/dL (ref 0.40–1.20)
GFR: 91.23 mL/min (ref >60.00)
GLUCOSE: 155 mg/dL — AB (ref 70–99)
Potassium: 4.8 mEq/L (ref 3.5–5.1)
SODIUM: 139 meq/L (ref 135–145)
Total Protein: 7.6 g/dL (ref 6.0–8.3)

## 2018-06-12 LAB — HEMOGLOBIN A1C: HEMOGLOBIN A1C: 8.5 % — AB (ref 4.6–6.5)

## 2018-06-12 MED ORDER — METFORMIN HCL ER 500 MG PO TB24: ORAL_TABLET | ORAL | 1 refills | Status: AC

## 2018-06-12 MED ORDER — LOSARTAN POTASSIUM 50 MG PO TABS: 50.0000 mg | ORAL_TABLET | Freq: Every day | ORAL | 3 refills | Status: AC

## 2018-06-12 MED ORDER — CARBAMIDE PEROXIDE 6.5 % OT SOLN: 5.0000 [drp] | Freq: Two times a day (BID) | OTIC | 3 refills | Status: AC

## 2018-06-12 MED ORDER — PIOGLITAZONE HCL 15 MG PO TABS: 15.0000 mg | ORAL_TABLET | ORAL | 2 refills | Status: AC

## 2018-06-12 NOTE — Progress Notes (Signed)
Subjective:  Patient ID: Brooke Giles, female    DOB: 09/27/1939  Age: 77 y.o. MRN: [id number]  CC: Hospitalization Follow-up   HPI Phylisha Dix presents for follow-up status post recent ER visit for epistaxis.  Review of the medical record shows that anterior bleeding in the left nare was controlled with silver nitrate trach application.  Her blood pressure at that time was mildly elevated.  She has been taking her Cozaar she tells me.  BMP did show an elevated blood sugar at 316.  CBC, microalbumin and creatinine ratio, urinalysis and troponin levels were all normal.  Patient tells me that her blood sugars have been in the less than 150 range fasting over the last several months.  She was aware that she was due for follow-up back in August but told me that she did not have the money to put gas in her car to come into the office.  She has no close relatives that live nearby.  Her children are scattered.  She does have 1 sister living in this area but who is busy driving a school bus.  She has been using the Debrox drops in her ears with some good result.  She lives alone but does have a faith community at the [employer] [facility].  She is active in the Sunday school class there.  She reports compliance with the Actos and metformin.  She has never had a heart attack or a stroke to her knowledge.  She does see the eye doctor regularly for follow-up of her glaucoma.    Family History  Problem Relation Age of Onset  . Anuerysm Mother   . Heart disease Father   . Heart disease Son     Outpatient Medications Prior to Visit  Medication Sig Dispense Refill  . latanoprost (XALATAN) 0.005 % ophthalmic solution Place 1 drop into both eyes at bedtime.     Marland Kitchen aspirin 81 MG tablet Take 81 mg by mouth daily.     . carbamide peroxide (DEBROX) 6.5 % OTIC solution Place 5 drops into both ears 2 (two) times daily. 15 mL 3  . losartan (COZAAR) 50 MG tablet Take 1 tablet (50 mg total) by mouth daily. 100  tablet 3  . metFORMIN (GLUCOPHAGE-XR) 500 MG 24 hr tablet Take 2 tablets twice each day. (Patient taking differently: Take 1,000 mg by mouth 2 (two) times daily. ) 360 tablet 1  . pioglitazone (ACTOS) 15 MG tablet Take 1 tablet (15 mg total) by mouth every morning. (Patient taking differently: Take 15 mg by mouth daily. ) 30 tablet 2   No facility-administered medications prior to visit.     ROS Review of Systems  Constitutional: Negative for chills, diaphoresis, fatigue, fever and unexpected weight change.  HENT: Positive for nosebleeds. Negative for ear discharge and ear pain.   Eyes: Negative for photophobia and visual disturbance.  Respiratory: Negative.   Cardiovascular: Negative.   Gastrointestinal: Negative.   Endocrine: Negative for polyphagia and polyuria.  Genitourinary: Negative.   Musculoskeletal: Negative for back pain and gait problem.  Skin: Negative for pallor and rash.  Allergic/Immunologic: Negative for immunocompromised state.  Neurological: Negative for light-headedness and headaches.  Hematological: Does not bruise/bleed easily.  Psychiatric/Behavioral: Negative.     Objective:  BP 128/80   Pulse 79   Temp 97.8 F (36.6 C) (Oral)   Ht 5\' 4"  (1.626 m)   Wt 156 lb 2 oz (70.8 kg)   SpO2 100%   BMI 26.80 kg/m  BP Readings from Last 3 Encounters:  06/12/18 128/80  06/05/18 127/62  01/09/18 132/80    Wt Readings from Last 3 Encounters:  06/12/18 156 lb 2 oz (70.8 kg)  06/05/18 152 lb (68.9 kg)  01/09/18 164 lb 2 oz (74.4 kg)    Physical Exam  Constitutional: She is oriented to person, place, and time. She appears well-developed and well-nourished. No distress.  HENT:  Head: Normocephalic and atraumatic.  Right Ear: External ear normal. No foreign bodies.  Left Ear: External ear normal. A foreign body is present.  Nose: No nasal septal hematoma. No epistaxis.  No foreign bodies. Right sinus exhibits no maxillary sinus tenderness and no frontal  sinus tenderness. Left sinus exhibits no maxillary sinus tenderness and no frontal sinus tenderness.  Mouth/Throat: Oropharynx is clear and moist. No oropharyngeal exudate.  Eyes: Pupils are equal, round, and reactive to light. Conjunctivae and EOM are normal. Right eye exhibits no discharge. Left eye exhibits no discharge. No scleral icterus.  Neck: Neck supple. No JVD present. No tracheal deviation present. No thyromegaly present.  Cardiovascular: Normal rate, regular rhythm and normal heart sounds.  Pulmonary/Chest: Effort normal and breath sounds normal.  Musculoskeletal: She exhibits no edema.  Lymphadenopathy:    She has no cervical adenopathy.  Neurological: She is alert and oriented to person, place, and time.  Skin: Skin is warm and dry. She is not diaphoretic.  Psychiatric: She has a normal mood and affect. Her behavior is normal.    Lab Results  Component Value Date   WBC 4.8 06/05/2018   HGB 12.1 06/05/2018   HCT 38.9 06/05/2018   PLT 305 06/05/2018   GLUCOSE 316 (H) 06/05/2018   CHOL 182 12/28/2017   TRIG 137.0 12/28/2017   HDL 61.90 12/28/2017   LDLCALC 93 12/28/2017   ALT 15 12/28/2017   AST 16 12/28/2017   NA 136 06/05/2018   K 4.2 06/05/2018   CL 105 06/05/2018   CREATININE 0.68 06/05/2018   BUN 13 06/05/2018   CO2 26 06/05/2018   TSH 1.010 02/27/2013   HGBA1C 8.7 (H) 12/28/2017   MICROALBUR <0.7 12/28/2017    Dg Chest 2 View  Result Date: 06/05/2018 CLINICAL DATA:  Acute shortness of breath today. EXAM: CHEST - 2 VIEW COMPARISON:  03/03/2017 chest CT FINDINGS: The cardiomediastinal silhouette is unremarkable. There is no evidence of focal airspace disease, pulmonary edema, suspicious pulmonary nodule/mass, pleural effusion, or pneumothorax. No acute bony abnormalities are identified. IMPRESSION: No active cardiopulmonary disease. Electronically Signed   By: Harmon Pier M.D.   On: 06/05/2018 20:58   Procedure note: Left ear irrigated with equal parts H2O2  and warm water with little return. Patient would not tolerate further removal with curette.   Family History  Problem Relation Age of Onset  . Anuerysm Mother   . Heart disease Father   . Heart disease Son    Assessment & Plan:   Janijah was seen today for hospitalization follow-up.  Diagnoses and all orders for this visit:  Essential hypertension -     Comprehensive metabolic panel -     losartan (COZAAR) 50 MG tablet; Take 1 tablet (50 mg total) by mouth daily.  Controlled type 2 diabetes mellitus without complication, without long-term current use of insulin (HCC) -     Hemoglobin A1c -     Comprehensive metabolic panel  Glaucoma, unspecified glaucoma type, unspecified laterality  Ceruminosis, left -     Ambulatory referral to ENT  Excessive cerumen in  left ear canal -     carbamide peroxide (DEBROX) 6.5 % OTIC solution; Place 5 drops into both ears 2 (two) times daily.  Type 2 diabetes mellitus with complication, without long-term current use of insulin (HCC) -     losartan (COZAAR) 50 MG tablet; Take 1 tablet (50 mg total) by mouth daily. -     metFORMIN (GLUCOPHAGE-XR) 500 MG 24 hr tablet; Take 2 tablets twice each day. -     pioglitazone (ACTOS) 15 MG tablet; Take 1 tablet (15 mg total) by mouth every morning.   I have discontinued Quinnlyn Halpin's aspirin. I am also having her maintain her latanoprost, carbamide peroxide, losartan, metFORMIN, and pioglitazone.  Meds ordered this encounter  Medications  . carbamide peroxide (DEBROX) 6.5 % OTIC solution    Sig: Place 5 drops into both ears 2 (two) times daily.    Dispense:  15 mL    Refill:  3  . losartan (COZAAR) 50 MG tablet    Sig: Take 1 tablet (50 mg total) by mouth daily.    Dispense:  100 tablet    Refill:  3  . metFORMIN (GLUCOPHAGE-XR) 500 MG 24 hr tablet    Sig: Take 2 tablets twice each day.    Dispense:  360 tablet    Refill:  1  . pioglitazone (ACTOS) 15 MG tablet    Sig: Take 1 tablet (15 mg total) by  mouth every morning.    Dispense:  30 tablet    Refill:  2   Stressed the importance of 28-month follow-ups for her.  She tells me that her family got together and provided plans for her to do so.  Also suggested that she not hesitate to ask for help in her Sunday school class.  She was successful in clearing the wax out of her right ear.  Foreign body/cerumen in her left ear remains recalcitrant.  Will ask for ENT help with removal.  Follow-up: Return in about 3 months (around 09/12/2018).  Mliss Sax, MD

## 2018-06-14 ENCOUNTER — Other Ambulatory Visit: Payer: Self-pay

## 2018-06-14 NOTE — Patient Outreach (Signed)
[facility]) Care Management  06/14/2018  Theia Dezeeuw 09/27/1939 [id number]   Medication Adherence call to Mrs. Elease Hashimoto spoke with patient she is no longer taking Losartan 50 mg she  was having side effects ,doctor switch to a different medication  Mrs. Mignone is showing past due under [place] [employer].   Lillia Abed CPhT Pharmacy Technician [facility] Care Management Direct Dial [phone]  Fax [phone] [email or web address]

## 2018-12-11 IMAGING — CT CT ABD-PELV W/ CM
2 of 5 series · 15 of 46 positions shown, 17 images · IV contrast (ISOVUE)
Comparison: None.

CLINICAL DATA: 77 y/o  F; a days of constipation.

EXAM:
CT ABDOMEN AND PELVIS WITH CONTRAST
TECHNIQUE: Multidetector CT imaging of the abdomen and pelvis was performed
using the standard protocol following bolus administration of
intravenous contrast.
CONTRAST:  1 [id number]-USS IOPAMIDOL ([id number]-USS) INJECTION 61%

[Series 2: abd/pel with · axial · 0.74mm/px · z∈[+953,+1328]mm · 12 of 87 slices shown, 14 images]
[im 6/87  soft-tissue]
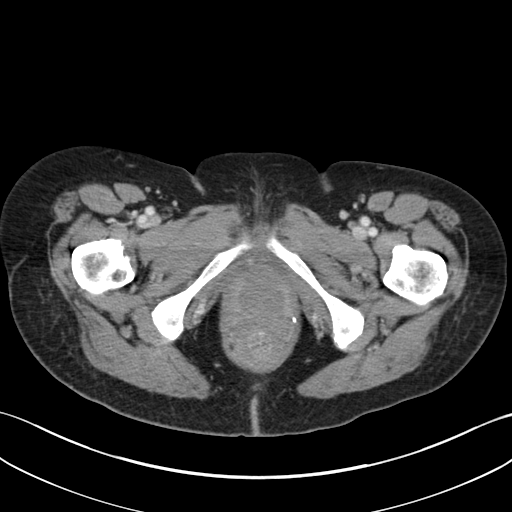
[im 6/87  bone]
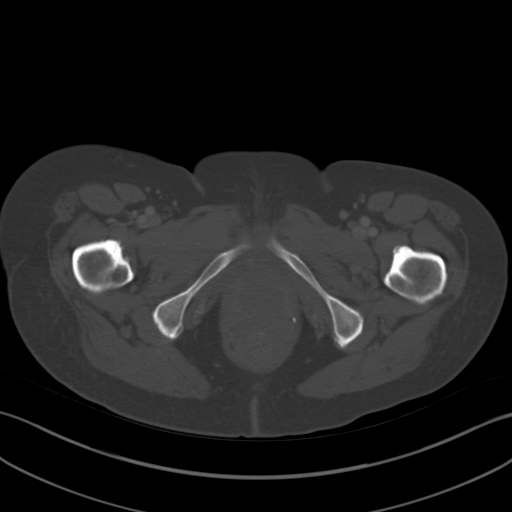
[im 11/87  soft-tissue]
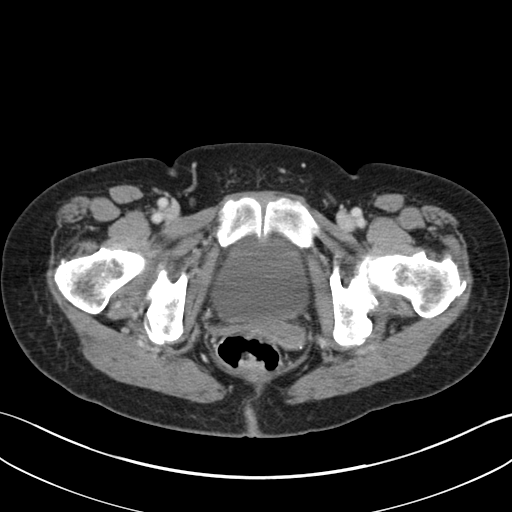
[im 22/87  soft-tissue]
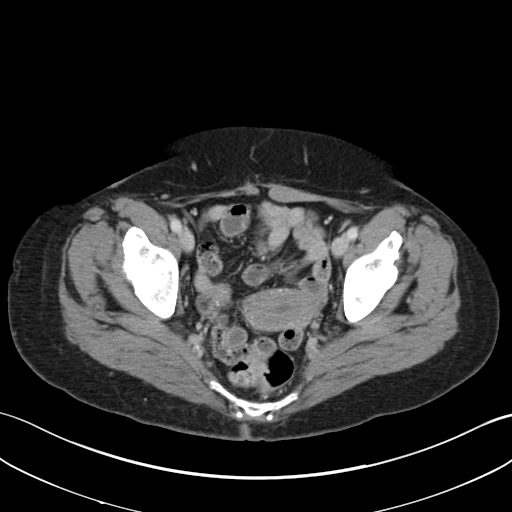
[im 27/87  soft-tissue]
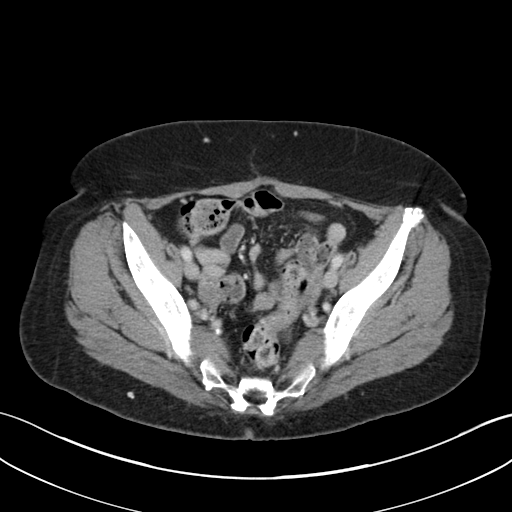
[im 33/87  soft-tissue]
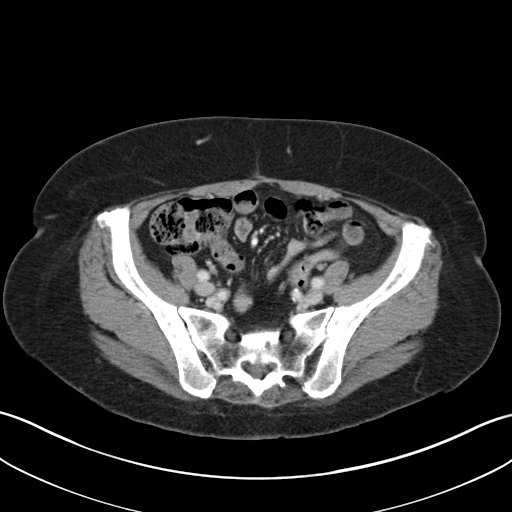
[im 38/87  soft-tissue]
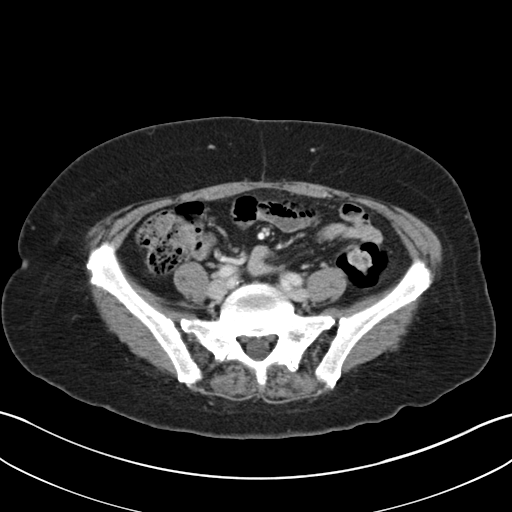
[im 49/87  soft-tissue]
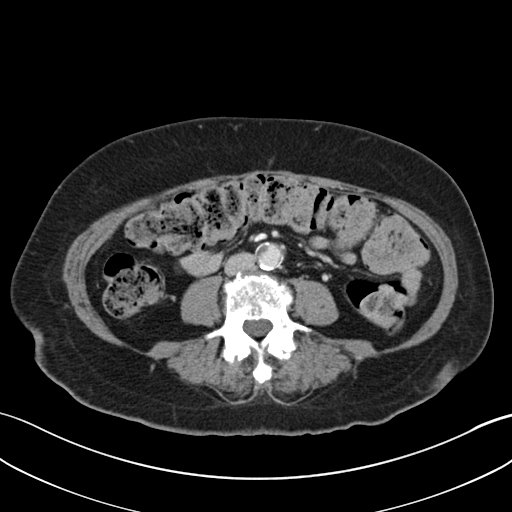
[im 54/87  soft-tissue]
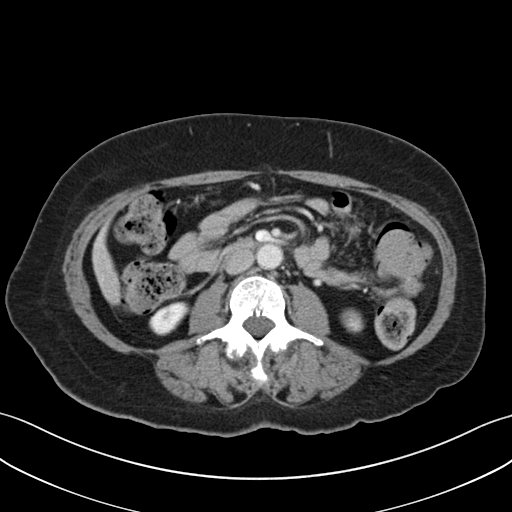
[im 60/87  soft-tissue]
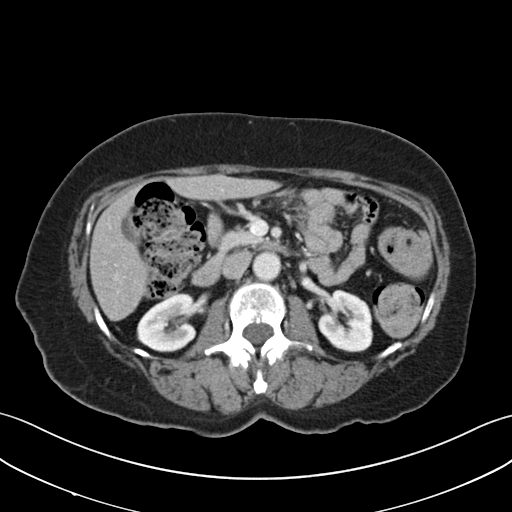
[im 60/87  bone]
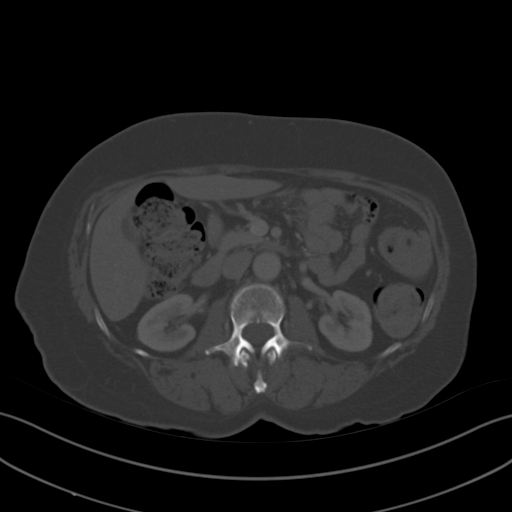
[im 65/87  soft-tissue]
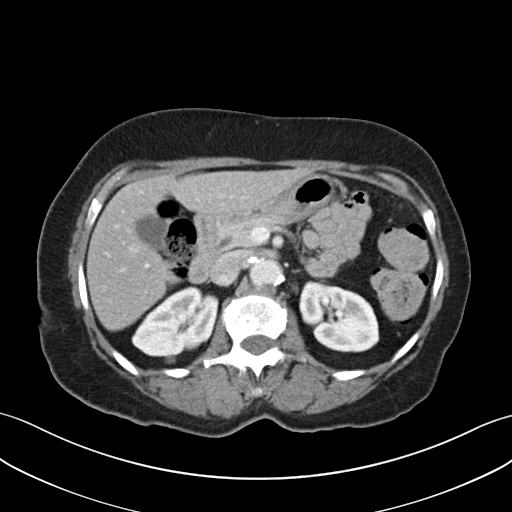
[im 76/87  soft-tissue]
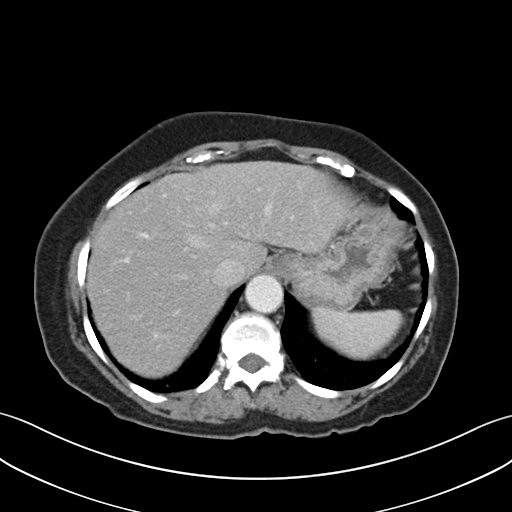
[im 81/87  soft-tissue]
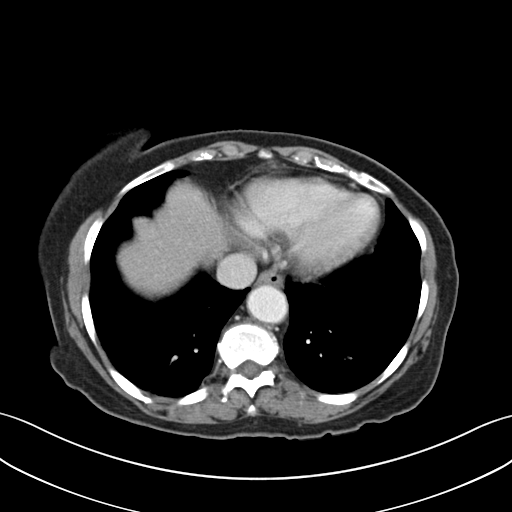

[Series 5: coronal a/|p · coronal · 0.71mm/px · 3 of 121 slices shown]
[im 41/121  soft-tissue]
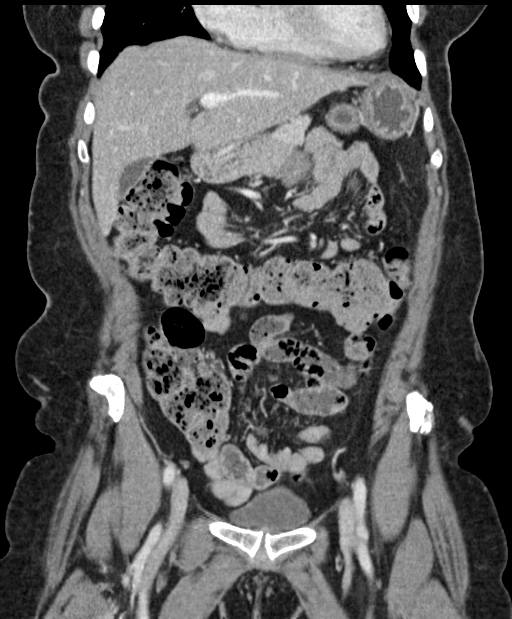
[im 54/121  soft-tissue]
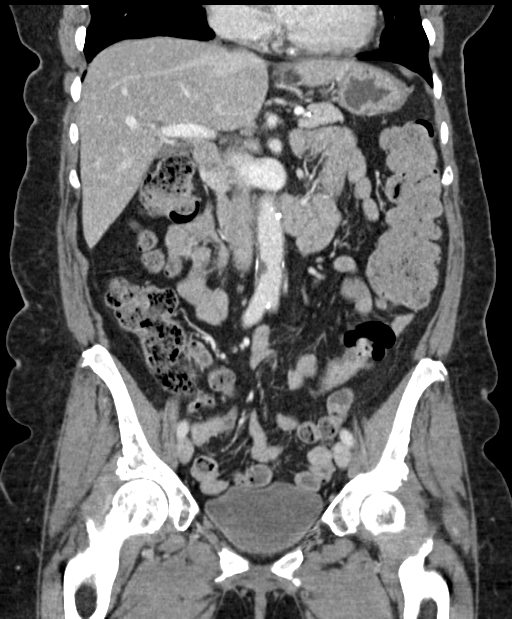
[im 67/121  soft-tissue]
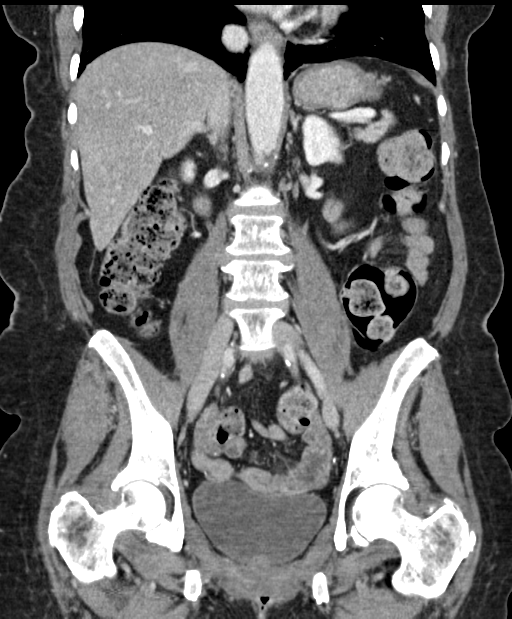

[15 of 46 positions shown; findings below may reference images not displayed]

FINDINGS: Lower chest: 3 mm left lower lobe pulmonary nodule (series 4, image
5).

Hepatobiliary: Subcentimeter lucency at the periphery of right lobe
of liver probably represents a cyst. Otherwise no focal liver
abnormality is seen. No gallstones, gallbladder wall thickening, or
biliary dilatation.

Pancreas: Unremarkable. No pancreatic ductal dilatation or
surrounding inflammatory changes.

Spleen: Normal in size without focal abnormality.

Adrenals/Urinary Tract: Right kidney interpolar cysts measuring up
to 15 mm. Kidneys are otherwise unremarkable. No hydronephrosis or
urinary stone disease. Normal bladder. Normal adrenal glands.

Stomach/Bowel: Stomach is within normal limits. Appendix appears
normal. No evidence of bowel wall thickening, distention, or
inflammatory changes. Scattered sigmoid diverticulosis. Moderate
volume of stool in the colon.

Vascular/Lymphatic: Aortic atherosclerosis. No enlarged abdominal or
pelvic lymph nodes. Dense plaque of the celiac axis M bilateral
renal artery origins with mild stenosis.

Reproductive: Uterus and bilateral adnexa are unremarkable.

Other: No abdominal wall hernia or abnormality. No abdominopelvic
ascites.

Musculoskeletal: Mild thoracolumbar spondylosis with disc and facet
degenerative changes. No high-grade bony canal stenosis. Mild
bilateral hip osteoarthrosis. No acute osseous abnormality is
evident.
IMPRESSION: 1. No acute process identified as explanation for abdominal pain.
Moderate volume of stool throughout the colon.
2. 3 mm left lower lobe pulmonary nodule. No follow-up needed if
patient is low-risk. Non-contrast chest CT can be considered in 12
months if patient is high-risk. This recommendation follows the
consensus statement: Guidelines for Management of Incidental
Pulmonary Nodules Detected on CT Images: From the [HOSPITAL]
3. Scattered sigmoid diverticulosis without evidence for
diverticulitis.
4. Moderate calcific atherosclerosis of abdominal aorta and mild
stenosis of celiac axis and renal artery origins.
5. Mild bilateral hip and thoracolumbar spine degenerative changes.

By: Sharice Lj M.D.

## 2019-12-23 ENCOUNTER — Emergency Department (HOSPITAL_COMMUNITY)
Admission: EM | Admit: 2019-12-23 | Discharge: 2019-12-24 | Discharge status: Against Medical Advice | Payer: Medicare Other | Attending: Emergency Medicine | Admitting: Emergency Medicine

## 2019-12-23 ENCOUNTER — Other Ambulatory Visit: Payer: Self-pay

## 2019-12-23 DIAGNOSIS — Z5321 Procedure and treatment not carried out due to patient leaving prior to being seen by health care provider: Secondary | ICD-10-CM | POA: Diagnosis not present

## 2019-12-23 DIAGNOSIS — W1830XA Fall on same level, unspecified, initial encounter: Secondary | ICD-10-CM | POA: Diagnosis not present

## 2019-12-23 DIAGNOSIS — S80212A Abrasion, left knee, initial encounter: Secondary | ICD-10-CM | POA: Insufficient documentation

## 2019-12-23 DIAGNOSIS — Y9301 Activity, walking, marching and hiking: Secondary | ICD-10-CM | POA: Insufficient documentation

## 2019-12-23 DIAGNOSIS — Y999 Unspecified external cause status: Secondary | ICD-10-CM | POA: Diagnosis not present

## 2019-12-23 DIAGNOSIS — Y929 Unspecified place or not applicable: Secondary | ICD-10-CM | POA: Diagnosis not present

## 2019-12-23 NOTE — ED Triage Notes (Signed)
Pt here from home after fall. Went for a walk with her friend and while on the walk her sciatic nerve pain made her fall, abrasion to L knee. Did not feel weak or dizzy prior to fall. Did not pass out or hit head. Not on blood thinners. Ambulatory afterwards.

## 2019-12-24 NOTE — ED Notes (Signed)
Pt called x 3  No answer. 

## 2020-07-01 ENCOUNTER — Emergency Department (HOSPITAL_COMMUNITY): Payer: Medicare Other

## 2020-07-01 ENCOUNTER — Emergency Department (HOSPITAL_BASED_OUTPATIENT_CLINIC_OR_DEPARTMENT_OTHER)
Admission: EM | Admit: 2020-07-01 | Discharge: 2020-07-01 | Disposition: A | Discharge status: Home / Self Care | Payer: Medicare Other | Source: Home / Self Care | Attending: Emergency Medicine | Admitting: Emergency Medicine

## 2020-07-01 ENCOUNTER — Other Ambulatory Visit: Payer: Self-pay

## 2020-07-01 ENCOUNTER — Emergency Department (HOSPITAL_COMMUNITY)
Admission: EM | Admit: 2020-07-01 | Discharge: 2020-07-01 | Disposition: A | Discharge status: Home / Self Care | Payer: Medicare Other | Attending: Emergency Medicine | Admitting: Emergency Medicine

## 2020-07-01 DIAGNOSIS — I10 Essential (primary) hypertension: Secondary | ICD-10-CM | POA: Diagnosis not present

## 2020-07-01 DIAGNOSIS — Z7984 Long term (current) use of oral hypoglycemic drugs: Secondary | ICD-10-CM | POA: Diagnosis not present

## 2020-07-01 DIAGNOSIS — E119 Type 2 diabetes mellitus without complications: Secondary | ICD-10-CM | POA: Insufficient documentation

## 2020-07-01 DIAGNOSIS — M25511 Pain in right shoulder: Secondary | ICD-10-CM | POA: Diagnosis not present

## 2020-07-01 DIAGNOSIS — Z79899 Other long term (current) drug therapy: Secondary | ICD-10-CM | POA: Insufficient documentation

## 2020-07-01 DIAGNOSIS — M79601 Pain in right arm: Secondary | ICD-10-CM | POA: Diagnosis present

## 2020-07-01 DIAGNOSIS — M7989 Other specified soft tissue disorders: Secondary | ICD-10-CM

## 2020-07-01 DIAGNOSIS — M79609 Pain in unspecified limb: Secondary | ICD-10-CM

## 2020-07-01 LAB — CBC
HCT: 39.9 % (ref 36.0–46.0)
Hemoglobin: 13 g/dL (ref 12.0–15.0)
MCH: 29 pg (ref 26.0–34.0)
MCHC: 32.6 g/dL (ref 30.0–36.0)
MCV: 89.1 fL (ref 80.0–100.0)
Platelets: 282 10*3/uL (ref 150–400)
RBC: 4.48 MIL/uL (ref 3.87–5.11)
RDW: 13.5 % (ref 11.5–15.5)
WBC: 4.8 10*3/uL (ref 4.0–10.5)
nRBC: 0 % (ref 0.0–0.2)

## 2020-07-01 LAB — CBG MONITORING, ED
Glucose-Capillary: 122 mg/dL — ABNORMAL HIGH (ref 70–99)
Glucose-Capillary: 94 mg/dL (ref 70–99)
Glucose-Capillary: 126 mg/dL — ABNORMAL HIGH (ref 70–99)

## 2020-07-01 LAB — BASIC METABOLIC PANEL
Anion gap: 9 (ref 5–15)
BUN: 14 mg/dL (ref 8–23)
CO2: 27 mmol/L (ref 22–32)
Calcium: 9.5 mg/dL (ref 8.9–10.3)
Chloride: 105 mmol/L (ref 98–111)
Creatinine, Ser: 0.64 mg/dL (ref 0.44–1.00)
GFR, Estimated: >60 mL/min (ref >60)
Glucose, Bld: 93 mg/dL (ref 70–99)
Potassium: 4.4 mmol/L (ref 3.5–5.1)
Sodium: 141 mmol/L (ref 135–145)

## 2020-07-01 MED ORDER — SODIUM CHLORIDE 0.9% FLUSH
3.0000 mL | Freq: Two times a day (BID) | INTRAVENOUS | Status: DC
  Administered 2020-07-01: 3 mL via INTRAVENOUS

## 2020-07-01 MED ORDER — HYDROCODONE-ACETAMINOPHEN 5-325 MG PO TABS: 1.0000 | ORAL_TABLET | Freq: Four times a day (QID) | ORAL | 0 refills | Status: AC | PRN

## 2020-07-01 MED ORDER — SODIUM CHLORIDE 0.9 % IV SOLN: 250.0000 mL | INTRAVENOUS | Status: DC | PRN

## 2020-07-01 MED ORDER — SODIUM CHLORIDE 0.9% FLUSH: 3.0000 mL | INTRAVENOUS | Status: DC | PRN

## 2020-07-01 MED ORDER — HYDROCODONE-ACETAMINOPHEN 5-325 MG PO TABS
1.0000 | ORAL_TABLET | Freq: Once | ORAL | Status: AC
  Administered 2020-07-01: 1 via ORAL
  Filled 2020-07-01: qty 1

## 2020-07-01 NOTE — ED Notes (Signed)
Patient given meal tray.  Sitting on the side of the bed eating.

## 2020-07-01 NOTE — ED Notes (Signed)
Meal tray given 

## 2020-07-01 NOTE — ED Triage Notes (Addendum)
Brooke Giles BIB [facility]  c/o right arm pain.  EMS was originally called out for hyperglycemia, fire got a sugar of 500 but EMS got a sugar of 171. Brooke Giles has a hx of dementia.  Brooke Giles does have some swelling to the wrist area and pain goes up into her shoulder.  Brooke Giles lives alone, no family present.  Brooke Giles denies any falls and pain has been going on for 2 weeks.  Brooke Giles states she saw her primary for pain but they were not able to help her.  Brooke Giles has taken ibuprofen which helped at first but now does not.   Vitals were 140/70 76-HR 18-RR 98% RA 171 CBG

## 2020-07-01 NOTE — Progress Notes (Signed)
Right upper extremity venous duplex has been completed. Preliminary results can be found in CV Proc through chart review.  Results were given to Dr. Criss Alvine.  07/01/20 10:08 AM Olen Cordial RVT

## 2020-07-01 NOTE — ED Notes (Signed)
Patient's daughter cannot be here until 2pm, Darl Pikes, charge nurse ok with patient staying until then.

## 2020-07-01 NOTE — ED Notes (Signed)
Patient transported to X-ray 

## 2020-07-01 NOTE — Discharge Instructions (Addendum)
If you develop fever, weakness or numbness in the arm, chest pain, shortness of breath, or any other new/concerning symptoms then return to the ER for evaluation.  Be sure to eat regular meals so your blood glucose does not go low.  Do not take your medicine, especially your Actos without eating.

## 2020-07-01 NOTE — ED Provider Notes (Addendum)
[facility]-EMERGENCY DEPT Provider Note   CSN: [id number] Arrival date & time: 07/01/20  4098     History Chief Complaint  Patient presents with  . Arm Pain    Brooke Giles is a 79 y.o. female.  HPI 79 year old female presents with right arm pain.  She states she has been treating it with ibuprofen for the last 3 weeks.  There has been no trauma.  No head or neck trauma.  Now she is getting pain going into her right lateral neck.  She states the pain is primarily upper arm but also involves her forearm.  There is no 1 specific spot.  No weakness or numbness in the arm though yesterday she noticed it was hard to reach with that arm because of pain.  She also noted that her glucose was elevated on her meter this morning but when EMS and fire department checked it it was in the 100s. No chest pain or fever. Feels like her arm is swollen.  Past Medical History:  Diagnosis Date  . Type II or unspecified type diabetes mellitus without mention of complication, not stated as uncontrolled 08/16/2011  . Unspecified constipation 08/16/2011    Patient Active Problem List   Diagnosis Date Noted  . History of epistaxis 06/12/2018  . HTN (hypertension) 12/28/2017  . Glaucoma 12/28/2017  . Ceruminosis, left 12/28/2017  . Type 2 diabetes mellitus with complication, without long-term current use of insulin (HCC) 02/27/2013  . Other malaise and fatigue 02/27/2013  . Unspecified constipation 02/27/2013    Past Surgical History:  Procedure Laterality Date  . COLON SURGERY  2009   benign mass  . KNEE SURGERY  1979  . SALIVARY GLAND SURGERY  2009     OB History   No obstetric history on file.     Family History  Problem Relation Age of Onset  . Anuerysm Mother   . Heart disease Father   . Heart disease Son     Social History   Tobacco Use  . Smoking status: Never Smoker  . Smokeless tobacco: Never Used  Vaping Use  . Vaping Use: Never used  Substance Use  Topics  . Alcohol use: No  . Drug use: No    Home Medications Prior to Admission medications   Medication Sig Start Date End Date Taking? Authorizing Provider  carbamide peroxide (DEBROX) 6.5 % OTIC solution Place 5 drops into both ears 2 (two) times daily. 06/12/18   Mliss Sax, MD  HYDROcodone-acetaminophen (NORCO) 5-325 MG tablet Take 1 tablet by mouth every 6 (six) hours as needed for severe pain. 07/01/20   Pricilla Loveless, MD  latanoprost (XALATAN) 0.005 % ophthalmic solution Place 1 drop into both eyes at bedtime.     [provider]  losartan (COZAAR) 50 MG tablet Take 1 tablet (50 mg total) by mouth daily. 06/12/18   Mliss Sax, MD  metFORMIN (GLUCOPHAGE-XR) 500 MG 24 hr tablet Take 2 tablets twice each day. 06/12/18   Mliss Sax, MD  pioglitazone (ACTOS) 15 MG tablet Take 1 tablet (15 mg total) by mouth every morning. 06/12/18   Mliss Sax, MD    Allergies    Aspirin  Review of Systems   Review of Systems  Constitutional: Negative for fever.  Musculoskeletal: Positive for arthralgias and neck pain.  Neurological: Negative for weakness and numbness.  All other systems reviewed and are negative.   Physical Exam Updated Vital Signs BP 137/67 (BP Location: Left Arm)  Pulse 63   Temp 98.1 F (36.7 C) (Oral)   Resp 20   Ht 5\' 4"  (1.626 m)   Wt 72.6 kg   SpO2 99%   BMI 27.46 kg/m   Physical Exam Vitals and nursing note reviewed.  Constitutional:      Appearance: She is well-developed.  HENT:     Head: Normocephalic and atraumatic.     Right Ear: External ear normal.     Left Ear: External ear normal.     Nose: Nose normal.  Eyes:     General:        Right eye: No discharge.        Left eye: No discharge.  Cardiovascular:     Rate and Rhythm: Normal rate and regular rhythm.     Pulses:          Radial pulses are 2+ on the right side.  Pulmonary:     Effort: Pulmonary effort is normal.     Breath  sounds: Normal breath sounds.  Abdominal:     General: There is no distension.  Musculoskeletal:     Right shoulder: Tenderness present. Decreased range of motion.     Right upper arm: Tenderness present.     Right elbow: Normal range of motion.     Right forearm: Tenderness present. No swelling or deformity.     Right wrist: No swelling or tenderness. Normal range of motion.     Right hand: No tenderness. Normal range of motion.     Cervical back: No tenderness. No spinous process tenderness or muscular tenderness.     Comments: Equal strength in both upper extremities. Normal gross sensation  Skin:    General: Skin is warm and dry.  Neurological:     Mental Status: She is alert and oriented to person, place, and time.  Psychiatric:        Mood and Affect: Mood is not anxious.     ED Results / Procedures / Treatments   Labs (all labs ordered are listed, but only abnormal results are displayed) Labs Reviewed  CBG MONITORING, ED - Abnormal; Notable for the following components:      Result Value   Glucose-Capillary 122 (*)    All other components within normal limits  CBG MONITORING, ED - Abnormal; Notable for the following components:   Glucose-Capillary 126 (*)    All other components within normal limits  BASIC METABOLIC PANEL  CBC  CBG MONITORING, ED  CBG MONITORING, ED  CBG MONITORING, ED    EKG None  Radiology DG Cervical Spine Complete  Result Date: 07/01/2020 CLINICAL DATA:  Cervical radiculopathy 79 year old with RIGHT arm pain EXAM: CERVICAL SPINE - COMPLETE 4+ VIEW COMPARISON:  None FINDINGS: Prevertebral soft tissues are normal. Multilevel degenerative changes greatest at C4-5, C5-6 and C6-7. Mild anterolisthesis of C6 on C7 with some straightening of normal cervical lordotic curvature. Anterolisthesis approximately 2-3 mm. Moderate neural foraminal narrowing on the RIGHT at C5-6 due to uncovertebral degenerative spurring. Moderate neural foraminal narrowing on  the LEFT at C4-5 due to a combination of uncovertebral degenerative spurring and facet arthropathy. No acute findings in the cervical spine. IMPRESSION: 1. Multilevel degenerative changes in the cervical spine as described. No acute findings. 2. Neural foraminal narrowing bilaterally as described. Electronically Signed   By: Donzetta KohutGeoffrey  Wile M.D.   On: 07/01/2020 09:41   DG Forearm Right  Result Date: 07/01/2020 CLINICAL DATA:  79 year old female with a history of arm pain for 3  weeks EXAM: RIGHT FOREARM - 2 VIEW COMPARISON:  None. FINDINGS: No acute displaced fracture. Degenerative changes at the elbow. No focal soft tissue swelling. No radiopaque foreign body. IMPRESSION: Negative for acute bony abnormality Electronically Signed   By: Gilmer Mor D.O.   On: 07/01/2020 08:32   DG Humerus Right  Result Date: 07/01/2020 CLINICAL DATA:  Arm pain for 3 weeks. No known trauma. History of dementia. EXAM: RIGHT HUMERUS - 2+ VIEW COMPARISON:  None FINDINGS: No acute fracture or dislocation. Degenerative changes noted at the glenohumeral joint. No radiopaque foreign bodies or soft tissue calcifications identified. IMPRESSION: 1. No acute findings. 2. Glenohumeral joint osteoarthritis. Electronically Signed   By: Signa Kell M.D.   On: 07/01/2020 08:32   UE VENOUS DUPLEX ([facility] & [facility] 7 am - 7 pm)  Result Date: 07/01/2020 UPPER VENOUS STUDY  Indications: Pain, and Swelling Risk Factors: None identified. Comparison Study: No prior studies. Performing Technologist: Chanda Busing RVT  Examination Guidelines: A complete evaluation includes B-mode imaging, spectral Doppler, color Doppler, and power Doppler as needed of all accessible portions of each vessel. Bilateral testing is considered an integral part of a complete examination. Limited examinations for reoccurring indications may be performed as noted.  Right Findings: +----------+------------+---------+-----------+----------+-------+ RIGHT      CompressiblePhasicitySpontaneousPropertiesSummary +----------+------------+---------+-----------+----------+-------+ IJV           Full       Yes       Yes                      +----------+------------+---------+-----------+----------+-------+ Subclavian    Full       Yes       Yes                      +----------+------------+---------+-----------+----------+-------+ Axillary      Full       Yes       Yes                      +----------+------------+---------+-----------+----------+-------+ Brachial      Full       Yes       Yes                      +----------+------------+---------+-----------+----------+-------+ Radial        Full                                          +----------+------------+---------+-----------+----------+-------+ Ulnar         Full                                          +----------+------------+---------+-----------+----------+-------+ Cephalic      Full                                          +----------+------------+---------+-----------+----------+-------+ Basilic       Full                                          +----------+------------+---------+-----------+----------+-------+  Left Findings: +----------+------------+---------+-----------+----------+-------+ LEFT      CompressiblePhasicitySpontaneousPropertiesSummary +----------+------------+---------+-----------+----------+-------+ Subclavian    Full       Yes       Yes                      +----------+------------+---------+-----------+----------+-------+  Summary:  Right: No evidence of deep vein thrombosis in the upper extremity. No evidence of superficial vein thrombosis in the upper extremity.  Left: No evidence of thrombosis in the subclavian.  *See table(s) above for measurements and observations.    Preliminary     Procedures Procedures (including critical care time)  Medications Ordered in ED Medications  sodium chloride flush (NS) 0.9  % injection 3 mL (3 mLs Intravenous Given 07/01/20 0956)  sodium chloride flush (NS) 0.9 % injection 3 mL (has no administration in time range)  0.9 %  sodium chloride infusion (has no administration in time range)  HYDROcodone-acetaminophen (NORCO/VICODIN) 5-325 MG per tablet 1 tablet (1 tablet Oral Given 07/01/20 0915)    ED Course  I have reviewed the triage vital signs and the nursing notes.  Pertinent labs & imaging results that were available during my care of the patient were reviewed by me and considered in my medical decision making (see chart for details).    MDM Rules/Calculators/A&P                          ED nurse note reports history of dementia but on my exam she is alert and oriented. Able to give full history. She is neurovascular intact in her right arm. She was given Norco and has better range of motion. I suspect this is ultimately from arthritis in her shoulder, possibly some cervical radiculopathy. However there is no indication that there is any acute neurologic compromise. DVT ultrasound is negative. X-rays are benign. Appears stable for discharge home with short course of pain control. Follow up with PCP. No chest complaints.  Of note, her glucose started to mildly drop on the emergency department.  She took her morning diabetic meds without food.  She was given food and it came up appropriately.  Labs otherwise benign. Final Clinical Impression(s) / ED Diagnoses Final diagnoses:  Acute pain of right shoulder    Rx / DC Orders ED Discharge Orders         Ordered    HYDROcodone-acetaminophen (NORCO) 5-325 MG tablet  Every 6 hours PRN        07/01/20 1113           Pricilla Loveless, MD 07/01/20 1150    Pricilla Loveless, MD 07/01/20 1214

## 2020-08-04 ENCOUNTER — Other Ambulatory Visit: Payer: Self-pay | Admitting: Family Medicine

## 2020-08-04 DIAGNOSIS — F0391 Unspecified dementia with behavioral disturbance: Secondary | ICD-10-CM

## 2020-08-04 DIAGNOSIS — R443 Hallucinations, unspecified: Secondary | ICD-10-CM

## 2020-08-04 DIAGNOSIS — R413 Other amnesia: Secondary | ICD-10-CM

## 2020-08-25 ENCOUNTER — Ambulatory Visit
Admission: RE | Admit: 2020-08-25 | Discharge: 2020-08-25 | Disposition: A | Discharge status: Home / Self Care | Payer: Medicare Other | Source: Ambulatory Visit | Attending: Family Medicine | Admitting: Family Medicine

## 2020-08-25 DIAGNOSIS — F0391 Unspecified dementia with behavioral disturbance: Secondary | ICD-10-CM

## 2020-08-25 DIAGNOSIS — R443 Hallucinations, unspecified: Secondary | ICD-10-CM

## 2020-08-25 DIAGNOSIS — R413 Other amnesia: Secondary | ICD-10-CM

## 2020-08-25 MED ORDER — GADOBENATE DIMEGLUMINE 529 MG/ML IV SOLN
12.0000 mL | Freq: Once | INTRAVENOUS | Status: AC | PRN
  Administered 2020-08-25: 12 mL via INTRAVENOUS

## 2021-02-13 ENCOUNTER — Other Ambulatory Visit: Payer: Self-pay | Admitting: Nurse Practitioner
